# Patient Record
Sex: Female | Born: 1980 | Race: White | Hispanic: No | Marital: Single | Smoking: Current every day smoker
Health system: Southern US, Community
[De-identification: ages and names within clinical notes are randomized; demographics above are authoritative.]

## PROBLEM LIST (undated history)

## (undated) DIAGNOSIS — F410 Panic disorder [episodic paroxysmal anxiety] without agoraphobia: Secondary | ICD-10-CM

## (undated) DIAGNOSIS — J40 Bronchitis, not specified as acute or chronic: Secondary | ICD-10-CM

## (undated) DIAGNOSIS — K219 Gastro-esophageal reflux disease without esophagitis: Secondary | ICD-10-CM

## (undated) DIAGNOSIS — M4710 Other spondylosis with myelopathy, site unspecified: Secondary | ICD-10-CM

## (undated) DIAGNOSIS — N390 Urinary tract infection, site not specified: Secondary | ICD-10-CM

## (undated) DIAGNOSIS — B1920 Unspecified viral hepatitis C without hepatic coma: Secondary | ICD-10-CM

## (undated) DIAGNOSIS — G8929 Other chronic pain: Secondary | ICD-10-CM

## (undated) DIAGNOSIS — F1911 Other psychoactive substance abuse, in remission: Secondary | ICD-10-CM

## (undated) DIAGNOSIS — M545 Low back pain: Secondary | ICD-10-CM

## (undated) DIAGNOSIS — R011 Cardiac murmur, unspecified: Secondary | ICD-10-CM

## (undated) HISTORY — DX: Unspecified viral hepatitis C without hepatic coma: B19.20

## (undated) HISTORY — PX: CARPAL TUNNEL RELEASE: SHX101

---
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
## 2006-01-31 HISTORY — PX: CHOLECYSTECTOMY: SHX55

## 2007-10-02 HISTORY — PX: ANTERIOR CERVICAL DECOMP/DISCECTOMY FUSION: SHX1161

## 2007-10-05 ENCOUNTER — Ambulatory Visit (HOSPITAL_COMMUNITY): Admission: RE | Admit: 2007-10-05 | Discharge: 2007-10-06 | Payer: Self-pay | Admitting: Neurological Surgery

## 2007-11-06 ENCOUNTER — Encounter: Admission: RE | Admit: 2007-11-06 | Discharge: 2007-11-06 | Payer: Self-pay | Admitting: Neurosurgery

## 2007-11-26 ENCOUNTER — Encounter: Admission: RE | Admit: 2007-11-26 | Discharge: 2007-11-26 | Payer: Self-pay | Admitting: Neurological Surgery

## 2008-01-21 ENCOUNTER — Encounter: Admission: RE | Admit: 2008-01-21 | Discharge: 2008-01-21 | Payer: Self-pay | Admitting: Neurological Surgery

## 2008-11-25 ENCOUNTER — Encounter: Admission: RE | Admit: 2008-11-25 | Discharge: 2008-11-25 | Payer: Self-pay | Admitting: Neurological Surgery

## 2008-12-04 ENCOUNTER — Ambulatory Visit (HOSPITAL_COMMUNITY): Admission: RE | Admit: 2008-12-04 | Discharge: 2008-12-04 | Payer: Self-pay | Admitting: Neurological Surgery

## 2009-01-31 HISTORY — PX: TUBAL LIGATION: SHX77

## 2010-05-05 LAB — BASIC METABOLIC PANEL
Chloride: 107 mEq/L (ref 96–112)
Creatinine, Ser: 0.72 mg/dL (ref 0.4–1.2)
Sodium: 140 mEq/L (ref 135–145)

## 2010-05-05 LAB — DIFFERENTIAL
Basophils Absolute: 0 10*3/uL (ref 0.0–0.1)
Basophils Relative: 0 % (ref 0–1)
Eosinophils Absolute: 0 10*3/uL (ref 0.0–0.7)
Eosinophils Relative: 1 % (ref 0–5)
Lymphocytes Relative: 54 % — ABNORMAL HIGH (ref 12–46)
Lymphs Abs: 3.2 10*3/uL (ref 0.7–4.0)
Monocytes Absolute: 0.3 10*3/uL (ref 0.1–1.0)
Neutrophils Relative %: 40 % — ABNORMAL LOW (ref 43–77)

## 2010-05-05 LAB — PROTIME-INR
INR: 1.1 (ref 0.00–1.49)
Prothrombin Time: 14.1 seconds (ref 11.6–15.2)

## 2010-05-05 LAB — CBC
Hemoglobin: 13.2 g/dL (ref 12.0–15.0)
MCHC: 34.8 g/dL (ref 30.0–36.0)
MCV: 91.4 fL (ref 78.0–100.0)
Platelets: 315 10*3/uL (ref 150–400)
RDW: 14.6 % (ref 11.5–15.5)

## 2010-06-15 NOTE — Op Note (Signed)
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   PROCEDURE:  1. Decompressive anterior cervical diskectomy C6-7.  2. Anterior cervical arthrodesis C6-7 utilizing a 6-mm      corticocancellous allograft.  3. Anterior cervical plating C6-7 utilizing a 23-mm Atlantis venture      plate.   SURGEON:  Tia Alert, MD   ASSISTANT:  Kathaleen Maser. Pool, MD   ANESTHESIA:  General endotracheal.   COMPLICATIONS:  None apparent.   INDICATIONS FOR PROCEDURE:  Monica Short is a very pleasant 30 year old  female who is referred with neck and right arm pain.  She had an MRI  which showed breakdown of C6-7 disk with focal kyphosis and focal  cervical disk herniation at C6-7.  I recommended anterior cervical  diskectomy, fusion, and plating at C6-7 in hopes of improving her pain  syndrome.  She understood the risks, benefits, expected outcome and  wished to proceed.   DESCRIPTION OF PROCEDURE:  The patient was taken to the operating room  and after induction of adequate generalized endotracheal anesthesia, she  was placed in supine position on the operating room table.  Her right  anterior cervical region was prepped with DuraPrep and draped in usual  sterile fashion.  3 mL of local anesthesia injected and a transverse  incision was made to the right of midline and carried down to the  platysma, which was elevated, opened, and undermined with Metzenbaum  scissors.  I then dissected in a plane medial to the  sternocleidomastoid  muscle and internal carotid artery, and lateral to the trachea and  esophagus to expose C6-7.  Intraoperative fluoroscopy confirmed my level  and then the longus colli muscles were taken down and the Shadow-Line  retractors were placed under these to expose C6-7.  The annulus was  incised and initial diskectomy was done with pituitary rongeurs and  curved curettes.  I then used high-speed drill to drill the endplates to  prepare for later arthrodesis, drilling in a rectangular fashion.  I  drilled down to the level of the posterior longitudinal ligament,  posterior osteophytes.  The operating microscope was brought to the  field.  The posterior  longitudinal ligament was opened and then removed  in a circumferential fashion, while undercutting the bodies of C6-C7  until the dura was fully relaxed.  It was pushed away firmly when I  first opened the posterior longitudinal ligament, but it fully relaxed  upon decompression and marched along the superior endplate of C7 to  identify the pedicles and marched along the pedicles  to identify the C7  nerve roots bilaterally.  I then marched along the nerve roots to  decompress into the distal foramina.  Once I was done with  decompression, I palpated with a circumferential fashion with a nerve  hook to assure adequate decompression both visually and with tactile  feel.  I then irrigated with saline solution and inspected our  interspace and measured it to be 6 mm.  I used a 6-mm corticocancellous  allograft, tapped this into position at C6-7.  I then used a 23-mm  Atlantis venture plate and placed two 13 mm variable angle screws in the  bodies of C6-C7 and these were locked in plate by the locking mechanism  within the plate.  I then irrigated with saline solution containing  bacitracin, drop of  bleeding points.  Once meticulous hemostasis was  achieved, I closed the platysma with 3-0 Vicryl, subcuticular tissue  with 3-0  Vicryl, and closed the skin with Benzoin and Steri-Strips.  The  drapes were removed.  Sterile dressing was applied.  The patient was  awakened from general anesthesia and transferred to recovery room in  stable condition.  At the end of the procedure, all sponge, needle, and  instrument counts were correct.      Tia Alert, MD  Electronically Signed     DSJ/MEDQ  D:  10/05/2007  T:  10/06/2007  Job:  [id number]

## 2010-11-03 LAB — DIFFERENTIAL
Basophils Relative: 1
Eosinophils Relative: 1
Monocytes Absolute: 0.2
Monocytes Relative: 5

## 2010-11-03 LAB — CBC
MCV: 95.6
WBC: 4.9

## 2010-11-03 LAB — BASIC METABOLIC PANEL
BUN: 6
Chloride: 106
Creatinine, Ser: 0.69
GFR calc non Af Amer: >60
Glucose, Bld: 117 — ABNORMAL HIGH
Sodium: 137

## 2011-03-14 ENCOUNTER — Other Ambulatory Visit: Payer: Self-pay | Admitting: Anesthesiology

## 2011-03-14 DIAGNOSIS — M545 Low back pain: Secondary | ICD-10-CM

## 2011-03-15 ENCOUNTER — Other Ambulatory Visit: Payer: Self-pay

## 2011-03-28 ENCOUNTER — Other Ambulatory Visit: Payer: Self-pay

## 2011-04-06 ENCOUNTER — Other Ambulatory Visit: Payer: Self-pay | Admitting: Neurological Surgery

## 2011-04-06 DIAGNOSIS — M79605 Pain in left leg: Secondary | ICD-10-CM

## 2011-04-06 DIAGNOSIS — M545 Low back pain: Secondary | ICD-10-CM

## 2011-04-07 ENCOUNTER — Other Ambulatory Visit: Payer: Self-pay | Admitting: Neurological Surgery

## 2011-04-07 ENCOUNTER — Ambulatory Visit
Admission: RE | Admit: 2011-04-07 | Discharge: 2011-04-07 | Disposition: A | Discharge status: Home / Self Care | Payer: Medicaid Other | Source: Ambulatory Visit | Attending: Neurological Surgery | Admitting: Neurological Surgery

## 2011-04-07 DIAGNOSIS — M545 Low back pain: Secondary | ICD-10-CM

## 2011-04-07 DIAGNOSIS — M79605 Pain in left leg: Secondary | ICD-10-CM

## 2011-04-07 DIAGNOSIS — M79604 Pain in right leg: Secondary | ICD-10-CM

## 2011-04-07 MED ORDER — KETOROLAC TROMETHAMINE 30 MG/ML IJ SOLN
30.0000 mg | Freq: Once | INTRAMUSCULAR | Status: AC
  Administered 2011-04-07: 30 mg via INTRAVENOUS

## 2011-04-07 MED ORDER — MIDAZOLAM HCL 2 MG/2ML IJ SOLN
1.0000 mg | INTRAMUSCULAR | Status: DC | PRN
  Administered 2011-04-07 (×2): 1 mg via INTRAVENOUS

## 2011-04-07 MED ORDER — IOHEXOL 180 MG/ML  SOLN
3.5000 mL | Freq: Once | INTRAMUSCULAR | Status: AC | PRN
  Administered 2011-04-07: 3.5 mL

## 2011-04-07 MED ORDER — FENTANYL CITRATE 0.05 MG/ML IJ SOLN
25.0000 ug | INTRAMUSCULAR | Status: DC | PRN
  Administered 2011-04-07 (×2): 50 ug via INTRAVENOUS

## 2011-04-07 MED ORDER — ONDANSETRON HCL 4 MG/2ML IJ SOLN
4.0000 mg | Freq: Once | INTRAMUSCULAR | Status: AC
  Administered 2011-04-07: 4 mg via INTRAMUSCULAR

## 2011-04-07 MED ORDER — CEFAZOLIN SODIUM 1-5 GM-% IV SOLN
1.0000 g | Freq: Once | INTRAVENOUS | Status: AC
  Administered 2011-04-07: 1 g via INTRAVENOUS

## 2011-04-07 MED ORDER — SODIUM CHLORIDE 0.9 % IV SOLN
Freq: Once | INTRAVENOUS | Status: AC
  Administered 2011-04-07: 08:00:00 via INTRAVENOUS

## 2011-04-07 MED ORDER — MEPERIDINE HCL 100 MG/ML IJ SOLN
75.0000 mg | Freq: Once | INTRAMUSCULAR | Status: AC
  Administered 2011-04-07: 75 mg via INTRAMUSCULAR

## 2011-04-07 NOTE — Progress Notes (Signed)
Explained discharge instructions to pt, consent signed and IV to be started. Pt has no significant health history.  Lungs clear bilaterally and heart sounds regular.  Right leg pain is worse than left.

## 2011-04-07 NOTE — Discharge Instructions (Signed)
Discogram Post Procedure Discharge Instructions ° °1. May resume a regular diet and any medications that you routinely take (including pain medications). °2. No driving day of procedure. °3. Upon discharge go home and rest for at least 4 hours.  May use an ice pack as needed to injection sites on back.  Ice to back 30 minutes on and 30 minutes off, all day. °4. May remove bandades later, today. °5. It is not unusual to be sore for several days after this procedure. ° ° ° °Please contact our office at [phone] for the following symptoms: ° °· Fever greater than 100 degrees °· Increased swelling, pain, or redness at injection site. ° ° °Thank you for visiting [facility]. ° ° °

## 2011-04-29 ENCOUNTER — Encounter (HOSPITAL_COMMUNITY): Payer: Self-pay | Admitting: Pharmacy Technician

## 2011-05-05 ENCOUNTER — Inpatient Hospital Stay (HOSPITAL_COMMUNITY): Admission: RE | Admit: 2011-05-05 | Discharge: 2011-05-05 | Payer: Medicaid Other | Source: Ambulatory Visit

## 2011-05-10 ENCOUNTER — Encounter (HOSPITAL_COMMUNITY): Payer: Self-pay

## 2011-05-10 ENCOUNTER — Encounter (HOSPITAL_COMMUNITY)
Admission: RE | Admit: 2011-05-10 | Discharge: 2011-05-10 | Disposition: A | Discharge status: Home / Self Care | Payer: Medicaid Other | Source: Ambulatory Visit | Attending: Neurological Surgery | Admitting: Neurological Surgery

## 2011-05-10 HISTORY — DX: Other psychoactive substance abuse, in remission: F19.11

## 2011-05-10 HISTORY — DX: Other spondylosis with myelopathy, site unspecified: M47.10

## 2011-05-10 HISTORY — DX: Panic disorder (episodic paroxysmal anxiety): F41.0

## 2011-05-10 LAB — BASIC METABOLIC PANEL
BUN: 10 mg/dL (ref 6–23)
CO2: 31 mEq/L (ref 19–32)
Calcium: 9.4 mg/dL (ref 8.4–10.5)
GFR calc non Af Amer: >90 mL/min (ref >90)
Glucose, Bld: 83 mg/dL (ref 70–99)

## 2011-05-10 LAB — DIFFERENTIAL
Basophils Relative: 0 % (ref 0–1)
Eosinophils Absolute: 0.1 10*3/uL (ref 0.0–0.7)
Eosinophils Relative: 2 % (ref 0–5)
Lymphs Abs: 3.8 10*3/uL (ref 0.7–4.0)
Monocytes Relative: 7 % (ref 3–12)
Neutrophils Relative %: 35 % — ABNORMAL LOW (ref 43–77)

## 2011-05-10 LAB — TYPE AND SCREEN
ABO/RH(D): A POS
Antibody Screen: NEGATIVE

## 2011-05-10 LAB — CBC
Hemoglobin: 13.4 g/dL (ref 12.0–15.0)
MCH: 32.4 pg (ref 26.0–34.0)
MCHC: 35.3 g/dL (ref 30.0–36.0)
MCV: 92 fL (ref 78.0–100.0)
Platelets: 307 10*3/uL (ref 150–400)
RBC: 4.13 MIL/uL (ref 3.87–5.11)

## 2011-05-10 LAB — PROTIME-INR: INR: 1.02 (ref 0.00–1.49)

## 2011-05-10 LAB — ABO/RH: ABO/RH(D): A POS

## 2011-05-10 NOTE — Pre-Procedure Instructions (Signed)
20 MINSA W[address]DINGTON  05/10/2011   Your procedure is scheduled on:  *Friday, April 12**  Report to [facility] at 0530 AM.  Call this number if you have problems the morning of surgery: [phone]   Remember:   Do not eat food:After Midnight.  May have clear liquids: up to 4 Hours before arrival. (1:30)  Clear liquids include soda, tea, black coffee, apple or grape juice, broth.  Take these medicines the morning of surgery with A SIP OF WATER: *none**   Do not wear jewelry, make-up or nail polish.  Do not wear lotions, powders, or perfumes. You may wear deodorant.  Do not shave 48 hours prior to surgery.  Do not bring valuables to the hospital.  Contacts, dentures or bridgework may not be worn into surgery.  Leave suitcase in the car. After surgery it may be brought to your room.  For patients admitted to the hospital, checkout time is 11:00 AM the day of discharge.   Patients discharged the day of surgery will not be allowed to drive home.  Name and phone number of your driver: *n/a**  Special Instructions: CHG Shower Use Special Wash: 1/2 bottle night before surgery and 1/2 bottle morning of surgery.   Please read over the following fact sheets that you were given: Pain Booklet, Coughing and Deep Breathing, Blood Transfusion Information, MRSA Information and Surgical Site Infection Prevention

## 2011-05-12 MED ORDER — CEFAZOLIN SODIUM 1-5 GM-% IV SOLN: 1.0000 g | INTRAVENOUS | Status: DC

## 2011-05-12 MED ORDER — DEXAMETHASONE SODIUM PHOSPHATE 10 MG/ML IJ SOLN
10.0000 mg | Freq: Once | INTRAMUSCULAR | Status: AC
  Administered 2011-05-13: 10 mg via INTRAVENOUS
  Filled 2011-05-12 (×2): qty 1

## 2011-05-13 ENCOUNTER — Encounter (HOSPITAL_COMMUNITY)
Admission: RE | Disposition: A | Discharge status: Home / Self Care | Payer: Self-pay | Source: Ambulatory Visit | Attending: Neurological Surgery

## 2011-05-13 ENCOUNTER — Encounter (HOSPITAL_COMMUNITY): Payer: Self-pay | Admitting: *Deleted

## 2011-05-13 ENCOUNTER — Ambulatory Visit (HOSPITAL_COMMUNITY): Payer: Medicaid Other | Admitting: Anesthesiology

## 2011-05-13 ENCOUNTER — Encounter (HOSPITAL_COMMUNITY): Payer: Self-pay | Admitting: Anesthesiology

## 2011-05-13 ENCOUNTER — Ambulatory Visit (HOSPITAL_COMMUNITY): Payer: Medicaid Other

## 2011-05-13 ENCOUNTER — Inpatient Hospital Stay (HOSPITAL_COMMUNITY)
Admission: RE | Admit: 2011-05-13 | Discharge: 2011-05-17 | DRG: 460 | Disposition: A | Discharge status: Home / Self Care | Payer: Medicaid Other | Source: Ambulatory Visit | Attending: Neurological Surgery | Admitting: Neurological Surgery

## 2011-05-13 ENCOUNTER — Encounter (HOSPITAL_COMMUNITY): Payer: Self-pay | Admitting: General Practice

## 2011-05-13 DIAGNOSIS — F410 Panic disorder [episodic paroxysmal anxiety] without agoraphobia: Secondary | ICD-10-CM

## 2011-05-13 DIAGNOSIS — Z23 Encounter for immunization: Secondary | ICD-10-CM

## 2011-05-13 DIAGNOSIS — Z01812 Encounter for preprocedural laboratory examination: Secondary | ICD-10-CM

## 2011-05-13 DIAGNOSIS — Z6831 Body mass index (BMI) 31.0-31.9, adult: Secondary | ICD-10-CM

## 2011-05-13 DIAGNOSIS — Z79899 Other long term (current) drug therapy: Secondary | ICD-10-CM

## 2011-05-13 DIAGNOSIS — M5126 Other intervertebral disc displacement, lumbar region: Principal | ICD-10-CM | POA: Diagnosis present

## 2011-05-13 DIAGNOSIS — F17200 Nicotine dependence, unspecified, uncomplicated: Secondary | ICD-10-CM | POA: Diagnosis present

## 2011-05-13 DIAGNOSIS — Z981 Arthrodesis status: Secondary | ICD-10-CM

## 2011-05-13 DIAGNOSIS — F1911 Other psychoactive substance abuse, in remission: Secondary | ICD-10-CM | POA: Diagnosis present

## 2011-05-13 DIAGNOSIS — Z01818 Encounter for other preprocedural examination: Secondary | ICD-10-CM

## 2011-05-13 HISTORY — DX: Panic disorder (episodic paroxysmal anxiety): F41.0

## 2011-05-13 HISTORY — DX: Other chronic pain: G89.29

## 2011-05-13 HISTORY — DX: Low back pain: M54.5

## 2011-05-13 HISTORY — DX: Gastro-esophageal reflux disease without esophagitis: K21.9

## 2011-05-13 HISTORY — DX: Bronchitis, not specified as acute or chronic: J40

## 2011-05-13 HISTORY — PX: POSTERIOR FUSION LUMBAR SPINE: SUR632

## 2011-05-13 HISTORY — DX: Urinary tract infection, site not specified: N39.0

## 2011-05-13 HISTORY — DX: Cardiac murmur, unspecified: R01.1

## 2011-05-13 LAB — HCG, SERUM, QUALITATIVE: Preg, Serum: NEGATIVE

## 2011-05-13 SURGERY — POSTERIOR LUMBAR FUSION 2 LEVEL
Anesthesia: General | Site: Back | Wound class: Clean

## 2011-05-13 MED ORDER — GLYCOPYRROLATE 0.2 MG/ML IJ SOLN
INTRAMUSCULAR | Status: DC | PRN
  Administered 2011-05-13: .6 mg via INTRAVENOUS

## 2011-05-13 MED ORDER — CELECOXIB 200 MG PO CAPS
200.0000 mg | ORAL_CAPSULE | Freq: Two times a day (BID) | ORAL | Status: DC
  Administered 2011-05-13 – 2011-05-17 (×8): 200 mg via ORAL
  Filled 2011-05-13 (×9): qty 1

## 2011-05-13 MED ORDER — MENTHOL 3 MG MT LOZG: 1.0000 | LOZENGE | OROMUCOSAL | Status: DC | PRN

## 2011-05-13 MED ORDER — MORPHINE SULFATE 4 MG/ML IJ SOLN
1.0000 mg | INTRAMUSCULAR | Status: DC | PRN
  Administered 2011-05-13 – 2011-05-16 (×2): 4 mg via INTRAVENOUS
  Filled 2011-05-13 (×3): qty 1

## 2011-05-13 MED ORDER — SODIUM CHLORIDE 0.9 % IR SOLN
Status: DC | PRN
  Administered 2011-05-13: 07:00:00

## 2011-05-13 MED ORDER — KETOROLAC TROMETHAMINE 30 MG/ML IJ SOLN
INTRAMUSCULAR | Status: AC
  Administered 2011-05-13: 30 mg via INTRAVENOUS
  Filled 2011-05-13: qty 1

## 2011-05-13 MED ORDER — BUPIVACAINE HCL (PF) 0.25 % IJ SOLN
INTRAMUSCULAR | Status: DC | PRN
  Administered 2011-05-13: 30 mL

## 2011-05-13 MED ORDER — METHADONE HCL 10 MG/ML PO CONC: 75.0000 mg | Freq: Every day | ORAL | Status: DC

## 2011-05-13 MED ORDER — LACTATED RINGERS IV SOLN
INTRAVENOUS | Status: DC | PRN
  Administered 2011-05-13 (×2): via INTRAVENOUS

## 2011-05-13 MED ORDER — BACITRACIN 50000 UNITS IM SOLR
INTRAMUSCULAR | Status: AC
  Filled 2011-05-13: qty 1

## 2011-05-13 MED ORDER — NEOSTIGMINE METHYLSULFATE 1 MG/ML IJ SOLN
INTRAMUSCULAR | Status: DC | PRN
  Administered 2011-05-13: 4 mg via INTRAVENOUS

## 2011-05-13 MED ORDER — MORPHINE SULFATE 2 MG/ML IJ SOLN: 0.0500 mg/kg | INTRAMUSCULAR | Status: DC | PRN

## 2011-05-13 MED ORDER — SUFENTANIL CITRATE 50 MCG/ML IV SOLN
INTRAVENOUS | Status: DC | PRN
  Administered 2011-05-13 (×2): 10 ug via INTRAVENOUS
  Administered 2011-05-13 (×2): 5 ug via INTRAVENOUS
  Administered 2011-05-13: 20 ug via INTRAVENOUS
  Administered 2011-05-13: 10 ug via INTRAVENOUS

## 2011-05-13 MED ORDER — VECURONIUM BROMIDE 10 MG IV SOLR
INTRAVENOUS | Status: DC | PRN
  Administered 2011-05-13 (×2): 1 mg via INTRAVENOUS
  Administered 2011-05-13: 2 mg via INTRAVENOUS

## 2011-05-13 MED ORDER — ACETAMINOPHEN 650 MG RE SUPP: 650.0000 mg | RECTAL | Status: DC | PRN

## 2011-05-13 MED ORDER — PNEUMOCOCCAL VAC POLYVALENT 25 MCG/0.5ML IJ INJ
0.5000 mL | INJECTION | Freq: Once | INTRAMUSCULAR | Status: AC
  Administered 2011-05-13: 0.5 mL via INTRAMUSCULAR
  Filled 2011-05-13: qty 0.5

## 2011-05-13 MED ORDER — ACETAMINOPHEN 325 MG PO TABS: 650.0000 mg | ORAL_TABLET | ORAL | Status: DC | PRN

## 2011-05-13 MED ORDER — PHENOL 1.4 % MT LIQD: 1.0000 | OROMUCOSAL | Status: DC | PRN

## 2011-05-13 MED ORDER — PROPOFOL 10 MG/ML IV EMUL
INTRAVENOUS | Status: DC | PRN
  Administered 2011-05-13: 160 mg via INTRAVENOUS

## 2011-05-13 MED ORDER — LIDOCAINE HCL (CARDIAC) 20 MG/ML IV SOLN
INTRAVENOUS | Status: DC | PRN
  Administered 2011-05-13: 30 mg via INTRAVENOUS

## 2011-05-13 MED ORDER — THROMBIN 20000 UNITS EX KIT
PACK | CUTANEOUS | Status: DC | PRN
  Administered 2011-05-13: 07:00:00 via TOPICAL

## 2011-05-13 MED ORDER — POTASSIUM CHLORIDE IN NACL 20-0.9 MEQ/L-% IV SOLN
INTRAVENOUS | Status: DC
  Filled 2011-05-13 (×9): qty 1000

## 2011-05-13 MED ORDER — ARTIFICIAL TEARS OP OINT
TOPICAL_OINTMENT | OPHTHALMIC | Status: DC | PRN
  Administered 2011-05-13: 1 via OPHTHALMIC

## 2011-05-13 MED ORDER — SODIUM CHLORIDE 0.9 % IV SOLN
INTRAVENOUS | Status: AC
  Filled 2011-05-13: qty 500

## 2011-05-13 MED ORDER — KETOROLAC TROMETHAMINE 30 MG/ML IJ SOLN
30.0000 mg | Freq: Once | INTRAMUSCULAR | Status: AC
  Administered 2011-05-13: 30 mg via INTRAVENOUS
  Filled 2011-05-13: qty 1

## 2011-05-13 MED ORDER — ONDANSETRON HCL 4 MG/2ML IJ SOLN
INTRAMUSCULAR | Status: DC | PRN
  Administered 2011-05-13: 4 mg via INTRAVENOUS

## 2011-05-13 MED ORDER — DIAZEPAM 5 MG/ML IJ SOLN
INTRAMUSCULAR | Status: AC
  Administered 2011-05-13: 2.5 mg via INTRAVENOUS
  Filled 2011-05-13: qty 2

## 2011-05-13 MED ORDER — DEXAMETHASONE SODIUM PHOSPHATE 4 MG/ML IJ SOLN
4.0000 mg | Freq: Four times a day (QID) | INTRAMUSCULAR | Status: DC
  Filled 2011-05-13 (×16): qty 1

## 2011-05-13 MED ORDER — SODIUM CHLORIDE 0.9 % IV SOLN: 250.0000 mL | INTRAVENOUS | Status: DC

## 2011-05-13 MED ORDER — KETOROLAC TROMETHAMINE 30 MG/ML IJ SOLN
30.0000 mg | Freq: Once | INTRAMUSCULAR | Status: AC
  Administered 2011-05-13: 30 mg via INTRAVENOUS

## 2011-05-13 MED ORDER — SENNA 8.6 MG PO TABS
1.0000 | ORAL_TABLET | Freq: Two times a day (BID) | ORAL | Status: DC
  Administered 2011-05-13 – 2011-05-17 (×8): 8.6 mg via ORAL
  Filled 2011-05-13 (×9): qty 1

## 2011-05-13 MED ORDER — ONDANSETRON HCL 4 MG/2ML IJ SOLN: 4.0000 mg | INTRAMUSCULAR | Status: DC | PRN

## 2011-05-13 MED ORDER — DEXAMETHASONE 4 MG PO TABS
4.0000 mg | ORAL_TABLET | Freq: Four times a day (QID) | ORAL | Status: DC
  Administered 2011-05-13 – 2011-05-17 (×15): 4 mg via ORAL
  Filled 2011-05-13 (×19): qty 1

## 2011-05-13 MED ORDER — 0.9 % SODIUM CHLORIDE (POUR BTL) OPTIME
TOPICAL | Status: DC | PRN
  Administered 2011-05-13: 1000 mL

## 2011-05-13 MED ORDER — MIDAZOLAM HCL 5 MG/5ML IJ SOLN
INTRAMUSCULAR | Status: DC | PRN
  Administered 2011-05-13: 2 mg via INTRAVENOUS

## 2011-05-13 MED ORDER — OXYCODONE-ACETAMINOPHEN 5-325 MG PO TABS
1.0000 | ORAL_TABLET | ORAL | Status: DC | PRN
  Administered 2011-05-13 – 2011-05-15 (×6): 2 via ORAL
  Administered 2011-05-15: 1 via ORAL
  Administered 2011-05-15 – 2011-05-17 (×8): 2 via ORAL
  Filled 2011-05-13 (×15): qty 2

## 2011-05-13 MED ORDER — HETASTARCH-ELECTROLYTES 6 % IV SOLN
INTRAVENOUS | Status: DC | PRN
  Administered 2011-05-13: 11:00:00 via INTRAVENOUS

## 2011-05-13 MED ORDER — METHADONE HCL 10 MG PO TABS
75.0000 mg | ORAL_TABLET | Freq: Every day | ORAL | Status: DC
  Administered 2011-05-13 – 2011-05-17 (×5): 75 mg via ORAL
  Filled 2011-05-13 (×3): qty 7
  Filled 2011-05-13: qty 8
  Filled 2011-05-13: qty 7

## 2011-05-13 MED ORDER — SODIUM CHLORIDE 0.9 % IJ SOLN
3.0000 mL | Freq: Two times a day (BID) | INTRAMUSCULAR | Status: DC
  Administered 2011-05-13 – 2011-05-15 (×3): 3 mL via INTRAVENOUS

## 2011-05-13 MED ORDER — ROCURONIUM BROMIDE 100 MG/10ML IV SOLN
INTRAVENOUS | Status: DC | PRN
  Administered 2011-05-13: 50 mg via INTRAVENOUS

## 2011-05-13 MED ORDER — LACTATED RINGERS IV SOLN: INTRAVENOUS | Status: DC

## 2011-05-13 MED ORDER — HYDROMORPHONE HCL PF 1 MG/ML IJ SOLN
0.2500 mg | INTRAMUSCULAR | Status: DC | PRN
  Administered 2011-05-13 (×4): 0.5 mg via INTRAVENOUS

## 2011-05-13 MED ORDER — SODIUM CHLORIDE 0.9 % IJ SOLN: 3.0000 mL | INTRAMUSCULAR | Status: DC | PRN

## 2011-05-13 MED ORDER — CEFAZOLIN SODIUM 1-5 GM-% IV SOLN
1.0000 g | Freq: Three times a day (TID) | INTRAVENOUS | Status: AC
  Administered 2011-05-13 – 2011-05-14 (×2): 1 g via INTRAVENOUS
  Filled 2011-05-13 (×2): qty 50

## 2011-05-13 MED ORDER — HYDROMORPHONE HCL PF 1 MG/ML IJ SOLN
INTRAMUSCULAR | Status: AC
  Administered 2011-05-13: 0.5 mg via INTRAVENOUS
  Filled 2011-05-13: qty 1

## 2011-05-13 MED ORDER — CEFAZOLIN SODIUM-DEXTROSE 2-3 GM-% IV SOLR
INTRAVENOUS | Status: AC
  Filled 2011-05-13: qty 50

## 2011-05-13 MED ORDER — METHOCARBAMOL 500 MG PO TABS
500.0000 mg | ORAL_TABLET | Freq: Four times a day (QID) | ORAL | Status: DC | PRN
  Administered 2011-05-13 – 2011-05-17 (×13): 500 mg via ORAL
  Filled 2011-05-13 (×14): qty 1

## 2011-05-13 MED ORDER — CEFAZOLIN SODIUM-DEXTROSE 2-3 GM-% IV SOLR
2.0000 g | INTRAVENOUS | Status: AC
  Administered 2011-05-13: 2 g via INTRAVENOUS

## 2011-05-13 MED ORDER — DIAZEPAM 5 MG/ML IJ SOLN
2.5000 mg | Freq: Once | INTRAMUSCULAR | Status: AC
  Administered 2011-05-13: 2.5 mg via INTRAVENOUS

## 2011-05-13 MED ORDER — METHOCARBAMOL 100 MG/ML IJ SOLN
500.0000 mg | Freq: Four times a day (QID) | INTRAVENOUS | Status: DC | PRN
  Administered 2011-05-13: 500 mg via INTRAVENOUS
  Filled 2011-05-13: qty 5

## 2011-05-13 SURGICAL SUPPLY — 64 items
BAG DECANTER FOR FLEXI CONT (MISCELLANEOUS) ×2 IMPLANT
BENZOIN TINCTURE PRP APPL 2/3 (GAUZE/BANDAGES/DRESSINGS) ×2 IMPLANT
BLADE SURG ROTATE 9660 (MISCELLANEOUS) IMPLANT
BONE EQUIVA 10CC (Bone Implant) ×2 IMPLANT
BUR MATCHSTICK NEURO 3.0 LAGG (BURR) ×2 IMPLANT
CANISTER SUCTION 2500CC (MISCELLANEOUS) ×2 IMPLANT
CLOTH BEACON ORANGE TIMEOUT ST (SAFETY) ×2 IMPLANT
CONT SPEC 4OZ CLIKSEAL STRL BL (MISCELLANEOUS) ×4 IMPLANT
COVER BACK TABLE 24X17X13 BIG (DRAPES) IMPLANT
COVER TABLE BACK 60X90 (DRAPES) ×2 IMPLANT
DRAPE C-ARM 42X72 X-RAY (DRAPES) ×4 IMPLANT
DRAPE LAPAROTOMY 100X72X124 (DRAPES) ×2 IMPLANT
DRAPE POUCH INSTRU U-SHP 10X18 (DRAPES) ×2 IMPLANT
DRAPE SURG 17X23 STRL (DRAPES) ×2 IMPLANT
DRESSING TELFA 8X3 (GAUZE/BANDAGES/DRESSINGS) ×2 IMPLANT
DRSG OPSITE 4X5.5 SM (GAUZE/BANDAGES/DRESSINGS) ×2 IMPLANT
DURAPREP 26ML APPLICATOR (WOUND CARE) ×2 IMPLANT
ELECT REM PT RETURN 9FT ADLT (ELECTROSURGICAL) ×2
ELECTRODE REM PT RTRN 9FT ADLT (ELECTROSURGICAL) ×1 IMPLANT
EVACUATOR 1/8 PVC DRAIN (DRAIN) ×2 IMPLANT
GAUZE SPONGE 4X4 16PLY XRAY LF (GAUZE/BANDAGES/DRESSINGS) ×2 IMPLANT
GLOVE BIO SURGEON STRL SZ8 (GLOVE) ×4 IMPLANT
GLOVE BIOGEL PI IND STRL 7.0 (GLOVE) ×1 IMPLANT
GLOVE BIOGEL PI IND STRL 8 (GLOVE) ×1 IMPLANT
GLOVE BIOGEL PI INDICATOR 7.0 (GLOVE) ×1
GLOVE BIOGEL PI INDICATOR 8 (GLOVE) ×1
GLOVE ECLIPSE 7.5 STRL STRAW (GLOVE) ×10 IMPLANT
GLOVE SURG SS PI 7.0 STRL IVOR (GLOVE) ×2 IMPLANT
GOWN BRE IMP SLV AUR LG STRL (GOWN DISPOSABLE) IMPLANT
GOWN BRE IMP SLV AUR XL STRL (GOWN DISPOSABLE) ×6 IMPLANT
GOWN STRL REIN 2XL LVL4 (GOWN DISPOSABLE) IMPLANT
HEMOSTAT POWDER KIT SURGIFOAM (HEMOSTASIS) IMPLANT
KIT BASIN OR (CUSTOM PROCEDURE TRAY) ×2 IMPLANT
KIT ROOM TURNOVER OR (KITS) ×2 IMPLANT
NEEDLE HYPO 25X1 1.5 SAFETY (NEEDLE) ×2 IMPLANT
NS IRRIG 1000ML POUR BTL (IV SOLUTION) ×2 IMPLANT
PACK LAMINECTOMY NEURO (CUSTOM PROCEDURE TRAY) ×2 IMPLANT
PAD ARMBOARD 7.5X6 YLW CONV (MISCELLANEOUS) ×6 IMPLANT
PEDIGUARD CURV (INSTRUMENTS) ×2 IMPLANT
ROD SEQUOIA BENT 5.5X60MM (Rod) ×4 IMPLANT
SCREW POLY 6.5X40MM (Screw) ×4 IMPLANT
SCREW POLY 6.5X45 (Screw) ×2 IMPLANT
SCREW POLY 6.5X45MM (Screw) ×2 IMPLANT
SCREW SEQUOIA 6.5X30MM (Screw) ×2 IMPLANT
SCREW SEQUOIA 6.5X35MM (Screw) ×4 IMPLANT
SPEEDLINK 11 MEDIUM (Rod) ×2 IMPLANT
SPONGE LAP 4X18 X RAY DECT (DISPOSABLE) IMPLANT
SPONGE SURGIFOAM ABS GEL 100 (HEMOSTASIS) ×2 IMPLANT
STRIP CLOSURE SKIN 1/2X4 (GAUZE/BANDAGES/DRESSINGS) ×2 IMPLANT
SUT VIC AB 0 CT1 18XCR BRD8 (SUTURE) ×2 IMPLANT
SUT VIC AB 0 CT1 8-18 (SUTURE) ×2
SUT VIC AB 2-0 CP2 18 (SUTURE) ×4 IMPLANT
SUT VIC AB 3-0 SH 8-18 (SUTURE) ×4 IMPLANT
SYR 20ML ECCENTRIC (SYRINGE) ×2 IMPLANT
TELAMON 12X22 (Cage) ×2 IMPLANT
TELAMON 22X10 (Cage) ×2 IMPLANT
TOP CLSR SEQUOIA (Orthopedic Implant) ×12 IMPLANT
TOWEL OR 17X24 6PK STRL BLUE (TOWEL DISPOSABLE) ×2 IMPLANT
TOWEL OR 17X26 10 PK STRL BLUE (TOWEL DISPOSABLE) ×2 IMPLANT
TRAP SPECIMEN MUCOUS 40CC (MISCELLANEOUS) ×2 IMPLANT
TRAY FOLEY CATH 14FRSI W/METER (CATHETERS) ×2 IMPLANT
WATER STERILE IRR 1000ML POUR (IV SOLUTION) ×2 IMPLANT
WEDGE TANGENT 10X26MM ×2 IMPLANT
WEDGE TANGENT 12X26MM ×2 IMPLANT

## 2011-05-13 NOTE — Progress Notes (Signed)
Patient ID: Monica Short, female   DOB: [birth year], 31 y.o.   MRN: [id number] Pt complaining of back pain radiating to lateral hips, and some into the upper anterior thigh in recovery room. Denies numbness and tingling. Has good strength in the hip flexors, knee extensors, dorsiflexors and plantar flexors to in-bed exam. Hemovac working correctly. Suspect this is post-op radiculitis from nerve retraction and distraction of disc space, especially given lack of N/T/W. However, this could represent post-op hematoma formation, and will monitor closely in recovery for now. Discussed with 2 other neurosurgeons and we all agree this not warrant early re-exploration or imaging at this point. We'll give a small dose of Valium for muscle relaxation and a dose of Toradol for pain, and monitor. She was on a high dose of methadone preoperatively along with Percocet,  So pain control may be an issue.

## 2011-05-13 NOTE — Anesthesia Postprocedure Evaluation (Signed)
  Anesthesia Post-op Note  Patient: Monica Short  Procedure(s) Performed: Procedure(s) (LRB): POSTERIOR LUMBAR FUSION 2 LEVEL (N/A)  Patient Location: PACU  Anesthesia Type: General  Level of Consciousness: awake  Airway and Oxygen Therapy: Patient Spontanous Breathing  Post-op Pain: mild  Post-op Assessment: Post-op Vital signs reviewed  Post-op Vital Signs: Reviewed  Complications: No apparent anesthesia complications

## 2011-05-13 NOTE — Transfer of Care (Signed)
Immediate Anesthesia Transfer of Care Note  Patient: Monica Short  Procedure(s) Performed: Procedure(s) (LRB): POSTERIOR LUMBAR FUSION 2 LEVEL (N/A)  Patient Location: PACU  Anesthesia Type: General  Level of Consciousness: awake, alert , oriented and patient cooperative  Airway & Oxygen Therapy: Patient Spontanous Breathing and Patient connected to face mask oxygen  Post-op Assessment: Report given to PACU RN, Post -op Vital signs reviewed and stable and Patient moving all extremities X 4  Post vital signs: Reviewed and stable  Complications: No apparent anesthesia complications

## 2011-05-13 NOTE — Progress Notes (Signed)
Dr Yetta Barre requests pt to stay in PACU until he is able to re-evaluate her (after he finishes in OR).

## 2011-05-13 NOTE — Op Note (Signed)
05/13/2011  11:53 AM  PATIENT:  Monica Short  31 y.o. female  PRE-OPERATIVE DIAGNOSIS:  Lumbar degenerative disc disease and spondylosis with lumbar disc herniation an annular tear L4-5 L5-S1, back and right leg pain  POST-OPERATIVE DIAGNOSIS:  Same  PROCEDURE:   1. Decompressive lumbar laminectomy L4-5 L5-S1 requiring more work than would be required of the typical PLIF procedure in order to adequately decompress the neural elements.  2. Posterior lumbar interbody fusion L4-5 L5-S1 using a PEEK interbody cage packed with morcellized allograft and autograft and an allograft wedge.  3. Posterior fixation L4-S1 using Zimmer Sequoia pedicle screws.  4. Intertransverse arthrodesis L4-S1 using morcellized autograft and allograft.  SURGEON:  Marikay Alar, MD  ASSISTANTS: Dr. Gerlene Fee  ANESTHESIA:  General  EBL: 100 ml  Total I/O In: 3800 [I.V.:3300; IV Piggyback:500] Out: 220 [Urine:120; Blood:100]  BLOOD ADMINISTERED:none  DRAINS: Hemovac   INDICATION FOR PROCEDURE: This patient presented with severe back pain and right leg pain. MRI showed a small herniated disc to the right with an annular tear L5-S1 with disc desiccation. Discogram had severe concordant pain at L4-5 L5-S1 with a normal control at L3-4. She tried medical management without relief. Recommended a 2 level lumbar fusion. Patient understood the risks, benefits, and alternatives and potential outcomes and wished to proceed.  PROCEDURE DETAILS:  The patient was brought to the operating room. After induction of generalized endotracheal anesthesia the patient was rolled into the prone position on chest rolls and all pressure points were padded. The patient's lumbar region was cleaned and then prepped with DuraPrep and draped in the usual sterile fashion. Anesthesia was injected and then a dorsal midline incision was made and carried down to the lumbosacral fascia. The fascia was opened and the paraspinous musculature was taken  down in a subperiosteal fashion to expose L4-5 and L5-S1. Intraoperative fluoroscopy confirmed my level, and the spinous process was removed and complete lumbar laminectomies, hemi- facetectomies, and foraminotomies were performed at 45 and L5-S1. The yellow ligament was removed to expose the underlying dura and nerve roots, and generous foraminotomies were performed to adequately decompress the neural elements. Wide foraminotomies were performed at L4-L5 and S1 nerve roots were decompressed. Once the decompression was complete, I turned my attention to the posterior lower lumbar interbody fusion. The epidural venous vasculature was coagulated and cut sharply. Disc space was incised and the initial discectomy was performed with pituitary rongeurs at both levels. The disc space was distracted with sequential distractors to a height of 12 mm at L4-5 and 10 mm at L5-S1. We then used a series of scrapers and shavers to prepare the endplates for fusion at both levels. The midline was prepared with Epstein curettes. Once the complete discectomy was finished, we packed an appropriate sized peek interbody cage with local autograft and morcellized allograft, gently retracted the nerve root, and tapped the cage into position at L4-5 and L5-S1. We also tapped a tangent interbody bone wedge into the opposite side. The midline was packed with morselized autograft and allograft. We then turned our attention to the posterior fixation. The pedicle screw entry zones were identified utilizing surface landmarks and fluoroscopy. We probed each pedicle with the pedicle probe and tapped each pedicle with the appropriate tap. We palpated with a ball probe to assure no break in the cortex. We then placed 6 5 x 45 mm pedicle screws into the pedicles bilaterally at L4, 6 5 x 40 mm pedicle screws and L5, and 6 5 x  35 mm pedicle screws into the sacrum. We then decorticated the transverse processes and laid a mixture of morcellized autograft  and allograft out over these to perform intertransverse arthrodesis at L4-S1. We then placed lordotic rods into the multiaxial screw heads of the pedicle screws and locked these in position with the locking caps and anti-torque device. We achieved compression of our grafts. We then checked our construct with AP and lateral fluoroscopy. Irrigated with copious amounts of bacitracin-containing saline solution. Placed a medium Hemovac drain through separate stab incision. Inspected the nerve roots once again to assure adequate decompression, lined to the dura with Gelfoam, dried up the surgical bed and closed the muscle and the fascia with 0 Vicryl. Closed the subcutaneous tissues with 2-0 Vicryl and subcuticular tissues with 3-0 Vicryl. The skin was closed with benzoin and Steri-Strips. Dressing was then applied, the patient was awakened from general anesthesia and transported to the recovery room in stable condition. At the end of the procedure all sponge, needle and instrument counts were correct.   PLAN OF CARE: Admit to inpatient   PATIENT DISPOSITION:  PACU - hemodynamically stable.   Delay start of Pharmacological VTE agent (>24hrs) due to surgical blood loss or risk of bleeding:  yes

## 2011-05-13 NOTE — Anesthesia Preprocedure Evaluation (Addendum)
Anesthesia Evaluation  Patient identified by MRN, date of birth, ID band Patient awake    Reviewed: Allergy & Precautions, H&P , NPO status , Patient's Chart, lab work & pertinent test results  Airway Mallampati: II TM Distance: >3 FB   Mouth opening: Limited Mouth Opening  Dental  (+) Upper Dentures and Lower Dentures   Pulmonary neg pulmonary ROS, Current Smoker,  breath sounds clear to auscultation        Cardiovascular Rhythm:Regular Rate:Normal     Neuro/Psych negative neurological ROS     GI/Hepatic negative GI ROS, Neg liver ROS,   Endo/Other  negative endocrine ROS  Renal/GU negative Renal ROS     Musculoskeletal negative musculoskeletal ROS (+)   Abdominal   Peds  Hematology negative hematology ROS (+)   Anesthesia Other Findings   Reproductive/Obstetrics                          Anesthesia Physical Anesthesia Plan  ASA: II  Anesthesia Plan: General   Post-op Pain Management:    Induction: Intravenous  Airway Management Planned: Oral ETT  Additional Equipment:   Intra-op Plan:   Post-operative Plan: Extubation in OR  Informed Consent:   Plan Discussed with: CRNA  Anesthesia Plan Comments:         Anesthesia Quick Evaluation

## 2011-05-13 NOTE — H&P (Signed)
Subjective: Patient is a 31 y.o. Short admitted for PLIF L4-5 L5-S1 for DDD/ back pain. Onset of symptoms was months ago, progressively worse since that time.  The pain is rated severe, and is located at the low back and radiates to leg. The pain is described as aching/ throbbing and occurs constantly. The symptoms have been progressive. Symptoms are exacerbated by exercise/ standing. MRI or CT showed DDD, diskogram + at L4-5, L5-S1.   Past Medical History  Diagnosis Date  . 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 Anxiety attack   . History of substance abuse     clean for 8 years    Past Surgical History  Procedure Date  . Cholecystectomy 2008  . Cervical spine surgery 2009  . Tonsillectomy 1992  . Carpal tunnel release 2010/2013    bilaterlly  . Tubal ligation 2011    Prior to Admission medications   Medication Sig Start Date End Date Taking? Authorizing Provider  methadone (DOLOPHINE) 10 MG/ML solution Take 75 mg by mouth daily.   Yes Historical Provider, MD  oxyCODONE-acetaminophen (PERCOCET) 5-325 MG per tablet Take 1 tablet by mouth every 4 (four) hours as needed.    Historical Provider, MD   Allergies  Allergen Reactions  . Chlorhexidine Rash    Pt states she can use the soap as long as it is removed and not left on the skin for a long  time    History  Substance Use Topics  . Smoking status: Current Everyday Smoker -- 0.5 packs/day for 18 years    Types: Cigarettes  . Smokeless tobacco: Never Used  . Alcohol Use: 1.8 oz/week    3 Cans of beer per week    Family History  Problem Relation Age of Onset  . Anesthesia problems Neg Hx      Review of Systems  Positive ROS: neg  All other systems have been reviewed and were otherwise negative with the exception of those mentioned in the HPI and as above.  Objective: Vital signs in last 24 hours: Temp:  [98 F (36.7 C)] 98 F (36.7 C) (04/12 0615) Pulse Rate:  [70] 70  (04/12 0615) Resp:   [18] 18  (04/12 0615) BP: (132)/(81) 132/81 mmHg (04/12 0615) SpO2:  [99 %] 99 % (04/12 0615)  General Appearance: Alert, cooperative, no distress, appears stated age Head: Normocephalic, without obvious abnormality, atraumatic Eyes: PERRL, conjunctiva/corneas clear, EOM's intact, fundi benign, both eyes      Ears: Normal TM's and external ear canals, both ears Throat: Lips, mucosa, and tongue normal; teeth and gums normal Neck: Supple, symmetrical, trachea midline, no adenopathy; thyroid: No enlargement/tenderness/nodules; no carotid bruit or JVD Back: Symmetric, no curvature, ROM normal, no CVA tenderness Lungs: Clear to auscultation bilaterally, respirations unlabored Heart: Regular rate and rhythm, S1 and S2 normal, no murmur, rub or gallop Abdomen: Soft, non-tender, bowel sounds active all four quadrants, no masses, no organomegaly Extremities: Extremities normal, atraumatic, no cyanosis or edema Pulses: 2+ and symmetric all extremities Skin: Skin color, texture, turgor normal, no rashes or lesions  NEUROLOGIC:   Mental status: Alert and oriented x4,  no aphasia, good attention span, fund of knowledge, and memory Motor Exam - grossly normal Sensory Exam - grossly normal Reflexes: 1+ Coordination - grossly normal Gait - grossly normal Balance - grossly normal Cranial Nerves: I: smell Not tested  II: visual acuity  OS: nl    OD: nl  II: visual fields Full to confrontation  II:  pupils Equal, round, reactive to light  III,VII: ptosis None  III,IV,VI: extraocular muscles  Full ROM  V: mastication Normal  V: facial light touch sensation  Normal  V,VII: corneal reflex  Present  VII: facial muscle function - upper  Normal  VII: facial muscle function - lower Normal  VIII: hearing Not tested  IX: soft palate elevation  Normal  IX,X: gag reflex Present  XI: trapezius strength  5/5  XI: sternocleidomastoid strength 5/5  XI: neck flexion strength  5/5  XII: tongue strength   Normal    Data Review Lab Results  Component Value Date   WBC 6.8 05/10/2011   HGB 13.4 05/10/2011   HCT Monica.0 05/10/2011   MCV 92.0 05/10/2011   PLT 307 05/10/2011   Lab Results  Component Value Date   NA 139 05/10/2011   K 3.9 05/10/2011   CL 100 05/10/2011   CO2 31 05/10/2011   BUN 10 05/10/2011   CREATININE 0.75 05/10/2011   GLUCOSE 83 05/10/2011   Lab Results  Component Value Date   INR 1.02 05/10/2011    Assessment/Plan: Patient admitted for PLIF L4-5, L5-S1. Diskogram positive. Patient has failed months of conservative therapy.  I explained the condition and procedure to the patient and answered any questions.  Patient wishes to proceed with procedure as planned. Understands risks/ benefits and typical outcomes of procedure.   Juwuan Sedita S 05/13/2011 6:28 AM

## 2011-05-13 NOTE — Progress Notes (Signed)
Dr Yetta Barre at bedside; evaluated pt. MD states pt may leave PACU and go to room.

## 2011-05-14 NOTE — Evaluation (Signed)
Physical Therapy Evaluation Patient Details Name: Monica Short MRN: [id number] DOB: 1980/04/25 Today's Date: 05/14/2011  Problem List: There is no problem list on file for this patient.   Past Medical History:  Past Medical History  Diagnosis Date  . 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 Anxiety attack   . History of substance abuse     clean for 8 years  . Chronic lower back pain   . Bronchitis     "over the years; last time was 02/2011"  . Heart murmur     "as a child"  . GERD (gastroesophageal reflux disease)   . Recurrent UTI   . Panic attacks 05/13/11    "have had a few lately"   Past Surgical History:  Past Surgical History  Procedure Date  . Cholecystectomy 2008  . Carpal tunnel release 2010/2013    bilaterlly  . Tubal ligation 2011  . Posterior fusion lumbar spine 05/13/11  . Anterior cervical decomp/discectomy fusion 10/2007  . Tonsillectomy and adenoidectomy 1992    PT Assessment/Plan/Recommendation PT Assessment Clinical Impression Statement: Pt presents with a medical diagnosis s/p PLIF L4-S1. Pt is near baseline functional level. Pt will benefit from skilled PT in the acute care setting in order to maximize functional mobility and safety prior to d/c PT Recommendation/Assessment: Patient will need skilled PT in the acute care venue PT Problem List: Decreased activity tolerance;Decreased mobility;Decreased knowledge of use of DME;Decreased knowledge of precautions;Pain PT Therapy Diagnosis : Acute pain;Difficulty walking PT Plan PT Frequency: Min 5X/week PT Treatment/Interventions: DME instruction;Gait training;Stair training;Functional mobility training;Therapeutic activities;Patient/family education PT Recommendation Follow Up Recommendations: No PT follow up;Supervision - Intermittent Equipment Recommended: None recommended by PT PT Goals  Acute Rehab PT Goals PT Goal Formulation: With patient Time For Goal Achievement: 7 days Pt  will go Supine/Side to Sit: Independently PT Goal: Supine/Side to Sit - Progress: Goal set today Pt will go Sit to Supine/Side: Independently PT Goal: Sit to Supine/Side - Progress: Goal set today Pt will go Sit to Stand: Independently PT Goal: Sit to Stand - Progress: Goal set today Pt will go Stand to Sit: Independently PT Goal: Stand to Sit - Progress: Goal set today Pt will Transfer Bed to Chair/Chair to Bed: with modified independence PT Transfer Goal: Bed to Chair/Chair to Bed - Progress: Goal set today Pt will Ambulate: >150 feet;with modified independence PT Goal: Ambulate - Progress: Goal set today Pt will Go Up / Down Stairs: 3-5 stairs;with rail(s);with modified independence PT Goal: Up/Down Stairs - Progress: Goal set today  PT Evaluation Precautions/Restrictions  Precautions Precautions: Back Precaution Booklet Issued: Yes (comment) Precaution Comments: pt educated on back precautions Required Braces or Orthoses: Spinal Brace Spinal Brace: Lumbar corset;Applied in sitting position Restrictions Weight Bearing Restrictions: No Prior Functioning  Home Living Lives With: Other (Comment) (parents, children) Available Help at Discharge: Family;Available 24 hours/day Type of Home: House Home Access: Stairs to enter [employer] of Steps: 5 Entrance Stairs-Rails: Right Home Layout: Two level;Able to live on main level with bedroom/bathroom Bathroom Shower/Tub: Tub/shower unit;Curtain;Walk-in shower Bathroom Toilet: Standard Bathroom Accessibility: Yes How Accessible: Accessible via walker Home Adaptive Equipment: Other (comment);Shower chair with back (rollator) Prior Function Level of Independence: Independent Able to Take Stairs?: Yes Driving: Yes 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 Cognition Cognition Arousal/Alertness: Awake/alert Overall Cognitive Status: Appears within functional limits for tasks assessed Orientation Level:  Oriented X4 Sensation/Coordination Sensation Light Touch: Appears Intact Extremity Assessment RLE Assessment RLE Assessment:  Within Functional Limits LLE Assessment LLE Assessment: Within Functional Limits Mobility (including Balance) Bed Mobility Bed Mobility: No Transfers Transfers: Yes Sit to Stand: 5: Supervision Sit to Stand Details (indicate cue type and reason): VC for hand placement and sequencing to maintain back precautions Stand to Sit: 5: Supervision;With upper extremity assist;To chair/3-in-1 Stand to Sit Details: VC for hand placement. Pt able to control descent Ambulation/Gait Ambulation/Gait: Yes Ambulation/Gait Assistance: 5: Supervision Ambulation/Gait Assistance Details (indicate cue type and reason): Supervision for safety secondary to pain and weakness from decreased ambulation Ambulation Distance (Feet): 300 Feet Assistive device: None Gait Pattern: Step-to pattern;Decreased stride length;Decreased hip/knee flexion - right;Decreased hip/knee flexion - left;Decreased trunk rotation Gait velocity: decreased gait speed Stairs: No    Exercise    End of Session PT - End of Session Equipment Utilized During Treatment: Gait belt;Back brace Activity Tolerance: Patient tolerated treatment well Patient left: in chair;with call bell in reach;with family/visitor present Nurse Communication: Mobility status for transfers;Mobility status for ambulation General Behavior During Session: [facility] for tasks performed Cognition: [facility] for tasks performed Patient Class: Inpatient Milana Kidney 05/14/2011, 5:50 PM  05/14/2011 Milana Kidney DPT PAGER: [phone] OFFICE: [phone]

## 2011-05-14 NOTE — Progress Notes (Signed)
Occupational Therapy Evaluation Patient Details Name: Monica Short MRN: [id number] DOB: Mar 12, 1980 Today's Date: 05/14/2011  Problem List: There is no problem list on file for this patient.   Past Medical History:  Past Medical History  Diagnosis Date  . 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 Anxiety attack   . History of substance abuse     clean for 8 years  . Chronic lower back pain   . Bronchitis     "over the years; last time was 02/2011"  . Heart murmur     "as a child"  . GERD (gastroesophageal reflux disease)   . Recurrent UTI   . Panic attacks 05/13/11    "have had a few lately"   Past Surgical History:  Past Surgical History  Procedure Date  . Cholecystectomy 2008  . Carpal tunnel release 2010/2013    bilaterlly  . Tubal ligation 2011  . Posterior fusion lumbar spine 05/13/11  . Anterior cervical decomp/discectomy fusion 10/2007  . Tonsillectomy and adenoidectomy 1992    OT Assessment/Plan/Recommendation OT Assessment Clinical Impression Statement: 31 yo s/p PLIF. All education completed regarding ADL retraining, back precautions and available DME and AE. No further OT needs.Family present for education. OT Recommendation/Assessment: Patient does not need any further OT services OT Recommendation Follow Up Recommendations: No OT follow up Equipment Recommended: None recommended by OT OT Goals Acute Rehab OT Goals OT Goal Formulation:  (eval only)  OT Evaluation Precautions/Restrictions  Precautions Precautions: Back Precaution Booklet Issued: Yes (comment) Precaution Comments: pt educated on back precautions Required Braces or Orthoses: Spinal Brace Spinal Brace: Lumbar corset;Applied in sitting position Restrictions Weight Bearing Restrictions: No Prior Functioning Home Living Lives With: Other (Comment) (parents, children) Available Help at Discharge: Family;Available 24 hours/day Type of Home: House Home Access: Stairs to  enter [employer] of Steps: 5 Entrance Stairs-Rails: Right Home Layout: Two level;Able to live on main level with bedroom/bathroom Bathroom Shower/Tub: Tub/shower unit;Curtain;Walk-in shower Bathroom Toilet: Standard Bathroom Accessibility: Yes How Accessible: Accessible via walker Home Adaptive Equipment: Other (comment);Shower chair with back (rollator) Prior Function Level of Independence: Independent Able to Take Stairs?: Yes Driving: Yes 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  ADL ADL Eating/Feeding: Performed;Independent Where Assessed - Eating/Feeding: Chair Grooming: Performed;Modified independent Where Assessed - Grooming: Standing at sink Upper Body Bathing: Simulated;Set up Where Assessed - Upper Body Bathing: Sitting, chair Lower Body Bathing: Simulated;Set up;Supervision/safety Where Assessed - Lower Body Bathing: Sit to stand from chair Upper Body Dressing: Performed;Set up Where Assessed - Upper Body Dressing: Sitting, chair;Unsupported Lower Body Dressing: Performed;Supervision/safety;Set up;Other (comment) (reviewed availability of AE) Where Assessed - Lower Body Dressing: Sitting, chair;Unsupported Toilet Transfer: Research scientist (life sciences) Method: Proofreader: Bedside commode Toileting - Clothing Manipulation: Performed;Independent Where Assessed - Toileting Clothing Manipulation: Standing Toileting - Hygiene: Performed;Minimal assistance Where Assessed - Toileting Hygiene: Standing;Other (comment) (educated about toilet aid) Tub/Shower Transfer: Other (comment) (pt plans to use walk in shower. has shower seat.) Tub/Shower Transfer Method: Ambulating Equipment Used: Long-handled sponge;Reacher;Rolling walker;Sock aid Ambulation Related to ADLs: supervision ADL Comments: reviewed available AE with pt/family. Pt/family verbalized understanding of back precautions and ADL. Family has DME if  needed. Has 3 in 1 to use short term. All education completed. No further OT needs. Vision/Perception    Cognition Cognition Arousal/Alertness: Awake/alert Overall Cognitive Status: Appears within functional limits for tasks assessed Orientation Level: Oriented X4 Sensation/Coordination Sensation Light Touch: Appears Intact Coordination Gross Motor Movements are Fluid and Coordinated:  Yes Fine Motor Movements are Fluid and Coordinated: Yes Extremity Assessment RUE Assessment RUE Assessment: Within Functional Limits LUE Assessment LUE Assessment: Within Functional Limits Mobility  Bed Mobility Bed Mobility: No Transfers Sit to Stand: 5: Supervision Sit to Stand Details (indicate cue type and reason): VC for hand placement and sequencing to maintain back precautions Stand to Sit: 5: Supervision;With upper extremity assist;To chair/3-in-1 Stand to Sit Details: VC for hand placement. Pt able to control descent Exercises   End of Session OT - End of Session Equipment Utilized During Treatment: Gait belt;Back brace Activity Tolerance: Patient tolerated treatment well Patient left: in chair;with call bell in reach;with family/visitor present Nurse Communication: Mobility status for transfers General Behavior During Session: [facility] for tasks performed Cognition: [facility] for tasks performed   [facility] 05/14/2011, 6:30 PM  [facility], OTR/L  [phone] 05/14/2011

## 2011-05-14 NOTE — Progress Notes (Signed)
Filed Vitals:   05/13/11 2128 05/14/11 0030 05/14/11 0230 05/14/11 0600  BP: 91/54 106/58  93/51  Pulse: 69  55 62  Temp: 98.2 F (36.8 C)  98.2 F (36.8 C) 97 F (36.1 C)  TempSrc:   Oral Oral  Resp:   18 18  Height:      Weight:      SpO2: 97%   93%    Patient sitting up in chair at bedside. Reasonably comfortable. Minimal staining on dressing. Wound drain in place. Encouraged to ambulate.  Plan: Continued to progress through postoperative recovery.  Hewitt Shorts, MD 05/14/2011, 9:24 AM

## 2011-05-15 NOTE — Progress Notes (Signed)
Patient ID: Monica Short, female   DOB: [birth year], 31 y.o.   MRN: [id number] Subjective:  The patient is alert and pleasant. Her back is appropriately sore.  Objective: Vital signs in last 24 hours: Temp:  [97.8 F (36.6 C)-98.7 F (37.1 C)] 98.4 F (36.9 C) (04/14 0615) Pulse Rate:  [53-64] 53  (04/14 0615) Resp:  [18] 18  (04/14 0615) BP: (89-111)/(52-71) 107/71 mmHg (04/14 0615) SpO2:  [91 %-96 %] 96 % (04/14 0615)  Intake/Output from previous day: 04/13 0701 - 04/14 0700 In: 1120 [P.O.:1120] Out: 250 [Drains:250] Intake/Output this shift:    Physical exam the patient is alert and oriented. She is moving her lower extremities well.  Lab Results: No results found for this basename: WBC:2,HGB:2,HCT:2,PLT:2 in the last 72 hours BMET No results found for this basename: NA:2,K:2,CL:2,CO2:2,GLUCOSE:2,BUN:2,CREATININE:2,CALCIUM:2 in the last 72 hours  Studies/Results: Dg Lumbar Spine 2-3 Views  05/13/2011  *RADIOLOGY REPORT*  Clinical Data: Back pain  LUMBAR SPINE - 2-3 VIEW  Comparison: 04/07/2011  Findings: C-arm films document L4-S1 PLIF.  No adverse features.  IMPRESSION: As above.  Original Report Authenticated By: Elsie Stain, M.D.   Dg C-arm [phone] Min  05/13/2011  *RADIOLOGY REPORT*  Clinical Data: Back pain  LUMBAR SPINE - 2-3 VIEW  Comparison: 04/07/2011  Findings: C-arm films document L4-S1 PLIF.  No adverse features.  IMPRESSION: As above.  Original Report Authenticated By: Elsie Stain, M.D.    Assessment/Plan: Postop day #2: We will remove her Hemovac drain today. And mobilize her with PT OT.  LOS: 2 days     [name] D 05/15/2011, 9:49 AM

## 2011-05-15 NOTE — Progress Notes (Signed)
Physical Therapy Treatment Patient Details Name: Monica Short MRN: [id number] DOB: 06-Sep-1980 Today's Date: 05/15/2011  PT Assessment/Plan  PT - Assessment/Plan Comments on Treatment Session: Pt progressing well. She completed stairs and ambulation with supervision only. Continue per plan to improve safety upon d/c (Pt is safe to d/c home with supervision) PT Plan: Discharge plan remains appropriate;Frequency remains appropriate PT Frequency: Min 5X/week Follow Up Recommendations: No PT follow up;Supervision - Intermittent Equipment Recommended: None recommended by OT;None recommended by PT PT Goals  Acute Rehab PT Goals PT Goal: Sit to Stand - Progress: Progressing toward goal PT Goal: Stand to Sit - Progress: Progressing toward goal PT Transfer Goal: Bed to Chair/Chair to Bed - Progress: Met PT Goal: Ambulate - Progress: Progressing toward goal PT Goal: Up/Down Stairs - Progress: Progressing toward goal  PT Treatment Precautions/Restrictions  Precautions Precautions: Back Precaution Booklet Issued: Yes (comment) Precaution Comments: pt able to recall and demonstrate 3/3 back precautions Required Braces or Orthoses: Spinal Brace Spinal Brace: Lumbar corset;Applied in sitting position Restrictions Weight Bearing Restrictions: No Mobility (including Balance) Bed Mobility Bed Mobility: No Transfers Transfers: Yes Sit to Stand: 6: Modified independent (Device/Increase time) Stand to Sit: 6: Modified independent (Device/Increase time) Ambulation/Gait Ambulation/Gait: Yes Ambulation/Gait Assistance: 5: Supervision Ambulation/Gait Assistance Details (indicate cue type and reason): Supervision for safety secondary to pt with slight unsteadiness and antalgic gait pattern Ambulation Distance (Feet): 200 Feet Assistive device: None Gait Pattern: Step-to pattern;Decreased stride length;Decreased hip/knee flexion - right;Decreased hip/knee flexion - left;Decreased trunk rotation Gait  velocity: decreased gait speed Stairs: Yes Stairs Assistance: 5: Supervision Stairs Assistance Details (indicate cue type and reason): VC for proper sequencing throughout gait for safety Stair Management Technique: One rail Right;Forwards;Step to pattern Number of Stairs: 10     Exercise    End of Session PT - End of Session Equipment Utilized During Treatment: Gait belt;Back brace Activity Tolerance: Patient tolerated treatment well Patient left: in chair;with call bell in reach Nurse Communication: Mobility status for transfers;Mobility status for ambulation General Behavior During Session: [facility] for tasks performed Cognition: [name] for tasks performed  Milana Kidney 05/15/2011, 1:35 PM  05/15/2011 Milana Kidney DPT PAGER: [phone] OFFICE: [phone]

## 2011-05-16 NOTE — Progress Notes (Signed)
UR COMPLETED  

## 2011-05-16 NOTE — Progress Notes (Signed)
Agree with d/c plan  05/16/2011 Milana Kidney DPT PAGER: [phone] OFFICE: [phone]

## 2011-05-16 NOTE — Progress Notes (Signed)
Patient ID: Monica Short, female   DOB: 1980/05/22, 31 y.o.   MRN: [id number] Subjective: Patient reports appropriate back soreness that is improving, with no real leg symptoms. Ambulating in hall taking laps from the nurses station with a walker  Objective: Vital signs in last 24 hours: Temp:  [97.8 F (36.6 C)-98.6 F (37 C)] 98.2 F (36.8 C) (04/15 0623) Pulse Rate:  [46-105] 46  (04/15 0623) Resp:  [18-20] 20  (04/15 0623) BP: (99-120)/(56-74) 117/70 mmHg (04/15 0623) SpO2:  [90 %-100 %] 90 % (04/15 0623)  Intake/Output from previous day:   Intake/Output this shift:    Neurologic: Grossly normal  Lab Results: Lab Results  Component Value Date   WBC 6.8 05/10/2011   HGB 13.4 05/10/2011   HCT 38.0 05/10/2011   MCV 92.0 05/10/2011   PLT 307 05/10/2011   Lab Results  Component Value Date   INR 1.02 05/10/2011   BMET Lab Results  Component Value Date   NA 139 05/10/2011   K 3.9 05/10/2011   CL 100 05/10/2011   CO2 31 05/10/2011   GLUCOSE 83 05/10/2011   BUN 10 05/10/2011   CREATININE 0.75 05/10/2011   CALCIUM 9.4 05/10/2011    Studies/Results: No results found.  Assessment/Plan: Doing well. Like one more day for pain control. Discharge tomorrow.   LOS: 3 days    Monica Short S 05/16/2011, 8:52 AM

## 2011-05-16 NOTE — Progress Notes (Signed)
Physical Therapy Treatment Patient Details Name: CHANDNI GAGAN MRN: [id number] DOB: [birth year] Today's Date: 05/16/2011  PT Assessment/Plan  PT - Assessment/Plan Comments on Treatment Session: Pt making very good progress. Independent with all precautions. Goals met and will sign off from acute PT services PT Plan: All goals met and education completed, patient dischaged from PT services Follow Up Recommendations: No PT follow up;Supervision - Intermittent Equipment Recommended: None recommended by PT;None recommended by OT PT Goals  Acute Rehab PT Goals PT Goal: Sit to Stand - Progress: Met PT Goal: Stand to Sit - Progress: Met PT Transfer Goal: Bed to Chair/Chair to Bed - Progress: Met PT Goal: Ambulate - Progress: Met PT Goal: Up/Down Stairs - Progress: Met  PT Treatment Precautions/Restrictions  Precautions Precautions: Back Precaution Booklet Issued: Yes (comment) Precaution Comments: pt able to recall and demonstrate 3/3 back precautions Required Braces or Orthoses: Spinal Brace Spinal Brace: Lumbar corset;Applied in sitting position Restrictions Weight Bearing Restrictions: No Mobility (including Balance) Transfers Sit to Stand: 7: Independent Stand to Sit: 7: Independent Ambulation/Gait Ambulation/Gait Assistance: 6: Modified independent (Device/Increase time) Ambulation Distance (Feet): 300 Feet Assistive device: Rolling walker Gait Pattern: Within Functional Limits Stairs Assistance: 6: Modified independent (Device/Increase time) Stair Management Technique: One rail Left;Forwards Number of Stairs: 10     Exercise    End of Session PT - End of Session Equipment Utilized During Treatment: Back brace Activity Tolerance: Patient tolerated treatment well Patient left: in chair;with call bell in reach General Behavior During Session: [facility] for tasks performed Cognition: [facility] for tasks performed  Fredrich Birks 05/16/2011, 11:48  AM 05/16/2011 Fredrich Birks PTA [phone] pager [phone] office

## 2011-05-17 MED ORDER — OXYCODONE-ACETAMINOPHEN 5-325 MG PO TABS
1.0000 | ORAL_TABLET | Freq: Four times a day (QID) | ORAL | Status: AC | PRN
Start: 2011-05-17 — End: 2011-05-27

## 2011-05-17 MED ORDER — METHOCARBAMOL 500 MG PO TABS: 500.0000 mg | ORAL_TABLET | Freq: Four times a day (QID) | ORAL | Status: AC | PRN

## 2011-05-17 MED FILL — Sodium Chloride IV Soln 0.9%: INTRAVENOUS | Qty: 1000 | Status: AC

## 2011-05-17 MED FILL — Heparin Sodium (Porcine) Inj 1000 Unit/ML: INTRAMUSCULAR | Qty: 30 | Status: AC

## 2011-05-17 MED FILL — Sodium Chloride Irrigation Soln 0.9%: Qty: 3000 | Status: AC

## 2011-05-17 NOTE — Discharge Summary (Signed)
Physician Discharge Summary  Patient ID: Monica Short MRN: [id number] DOB/AGE: 32/09/1980 32 y.o.  Admit date: 05/13/2011 Discharge date: 05/17/2011  Admission Diagnoses: DDD/ HNP L45, L5S1    Discharge Diagnoses: same   Discharged Condition: good  Hospital Course: The patient was admitted on 05/13/2011 and taken to the operating room where the patient underwent PLIF L4-5, L5-s1. The patient tolerated the procedure well and was taken to the recovery room and then to the floor in stable condition. The hospital course was routine. There were no complications. The wound remained clean dry and intact. Pt had appropriate back soreness. No complaints of leg pain or new N/T/W. The patient remained afebrile with stable vital signs, and tolerated a regular diet. The patient continued to increase activities, and pain was well controlled with oral pain medications.   Consults: None  Significant Diagnostic Studies:  Results for orders placed during the hospital encounter of 05/13/11  HCG, SERUM, QUALITATIVE      Component Value Range   Preg, Serum NEGATIVE  NEGATIVE     Chest 2 View  05/10/2011  *RADIOLOGY REPORT*  Clinical Data: Preop posterior lumbar fusion. Smoker.  CHEST - 2 VIEW  Comparison: 11/23/2006  Findings: Heart and mediastinal contours are within normal limits. No focal opacities or effusions.  No acute bony abnormality.  IMPRESSION: No active cardiopulmonary disease.  Original Report Authenticated By: Cyndie Chime, M.D.   Dg Lumbar Spine 2-3 Views  05/13/2011  *RADIOLOGY REPORT*  Clinical Data: Back pain  LUMBAR SPINE - 2-3 VIEW  Comparison: 04/07/2011  Findings: C-arm films document L4-S1 PLIF.  No adverse features.  IMPRESSION: As above.  Original Report Authenticated By: Elsie Stain, M.D.   Dg C-arm 203 023 1355 Min  05/13/2011  *RADIOLOGY REPORT*  Clinical Data: Back pain  LUMBAR SPINE - 2-3 VIEW  Comparison: 04/07/2011  Findings: C-arm films document L4-S1 PLIF.  No adverse  features.  IMPRESSION: As above.  Original Report Authenticated By: Elsie Stain, M.D.    Antibiotics:  Anti-infectives     Start     Dose/Rate Route Frequency Ordered Stop   05/13/11 1700   ceFAZolin (ANCEF) IVPB 1 g/50 mL premix        1 g 100 mL/hr over 30 Minutes Intravenous Every 8 hours 05/13/11 1602 05/14/11 0049   05/13/11 0759   bacitracin 32440 UNITS injection     Comments: Worthy Flank: cabinet override         05/13/11 0759 05/13/11 1959   05/13/11 0722   bacitracin 10272 UNITS injection  Status:  Discontinued     Comments: WALSH, AMY: cabinet override         05/13/11 0722 05/13/11 0721   05/13/11 0710   bacitracin 50,000 Units in sodium chloride irrigation 0.9 % 500 mL irrigation  Status:  Discontinued          As needed 05/13/11 0811 05/13/11 1132   05/13/11 0611   ceFAZolin (ANCEF) 2-3 GM-% IVPB SOLR     Comments: TODD, ROBERT: cabinet override         05/13/11 0611 05/13/11 1814   05/13/11 0606   ceFAZolin (ANCEF) IVPB 2 g/50 mL premix        2 g 100 mL/hr over 30 Minutes Intravenous 60 min pre-op 05/13/11 0607 05/13/11 0737   05/12/11 1413   ceFAZolin (ANCEF) IVPB 1 g/50 mL premix  Status:  Discontinued        1 g 100 mL/hr over 30 Minutes Intravenous 60  min pre-op 05/12/11 1413 05/13/11 0607          Discharge Exam: Blood pressure 117/74, pulse 57, temperature 98.6 F (37 C), temperature source Oral, resp. rate 18, height 5\' 5"  (1.651 m), weight 84.823 kg (187 lb), last menstrual period 05/09/2011, SpO2 98.00%. Neurologic: Grossly normal Incision CDI  Discharge Medications:   Medication List  As of 05/17/2011  8:23 AM   TAKE these medications         methadone 10 MG/ML solution   Commonly known as: DOLOPHINE   Take 75 mg by mouth daily.      methocarbamol 500 MG tablet   Commonly known as: ROBAXIN   Take 1 tablet (500 mg total) by mouth every 6 (six) hours as needed.      oxyCODONE-acetaminophen 5-325 MG per tablet   Commonly known as:  PERCOCET   Take 1-2 tablets by mouth every 6 (six) hours as needed for pain.            Disposition: home   Final Dx: PLIF  Discharge Orders    Future Orders Please Complete By Expires   Diet - low sodium heart healthy      Increase activity slowly      Driving Restrictions      Comments:   2 weeks   Lifting restrictions      Comments:   Less than 8 lbs   Remove dressing in 24 hours      Call MD for:  temperature >100.4      Call MD for:  persistant nausea and vomiting      Call MD for:  severe uncontrolled pain      Call MD for:  redness, tenderness, or signs of infection (pain, swelling, redness, odor or green/yellow discharge around incision site)      Call MD for:  difficulty breathing, headache or visual disturbances      Call MD for:  persistant dizziness or light-headedness         Follow-up Information    Follow up with Penny Arrambide S, MD. Schedule an appointment as soon as possible for a visit in 2 weeks.   Contact information:   [address] [place] [address] [phone]        Will also follow up with her pain mgmt doctor to continue to wean methadone (current dose 55 mg).   SignedTia Alert 05/17/2011, 8:23 AM

## 2011-05-17 NOTE — Progress Notes (Signed)
CARE MANAGEMENT NOTE 05/17/2011  Patient:  Monica Short, Monica Short   Account Number:  [id number]  Date Initiated:  05/17/2011  Documentation initiated by:  Vance Peper  Subjective/Objective Assessment:     Action/Plan:   No home health or DME needs identified.   Anticipated DC Date:  05/17/2011   Anticipated DC Plan:  HOME/SELF CARE Status of service:  Completed, signed off  Discharge Disposition:  HOME/SELF CARE

## 2011-06-29 ENCOUNTER — Other Ambulatory Visit: Payer: Self-pay | Admitting: Neurological Surgery

## 2011-06-29 DIAGNOSIS — M5137 Other intervertebral disc degeneration, lumbosacral region: Secondary | ICD-10-CM

## 2011-06-30 ENCOUNTER — Ambulatory Visit
Admission: RE | Admit: 2011-06-30 | Discharge: 2011-06-30 | Disposition: A | Discharge status: Home / Self Care | Payer: Medicaid Other | Source: Ambulatory Visit | Attending: Neurological Surgery | Admitting: Neurological Surgery

## 2011-06-30 ENCOUNTER — Other Ambulatory Visit: Payer: Medicaid Other

## 2011-06-30 DIAGNOSIS — M5137 Other intervertebral disc degeneration, lumbosacral region: Secondary | ICD-10-CM

## 2011-07-05 ENCOUNTER — Ambulatory Visit (HOSPITAL_COMMUNITY): Payer: Medicaid Other

## 2011-07-06 ENCOUNTER — Other Ambulatory Visit: Payer: Self-pay | Admitting: Respiratory Therapy

## 2011-07-06 ENCOUNTER — Other Ambulatory Visit: Payer: Self-pay | Admitting: Pharmacist

## 2011-07-06 ENCOUNTER — Other Ambulatory Visit: Payer: Self-pay | Admitting: Neurological Surgery

## 2011-07-06 ENCOUNTER — Ambulatory Visit (HOSPITAL_COMMUNITY)
Admission: RE | Admit: 2011-07-06 | Discharge: 2011-07-06 | Disposition: A | Discharge status: Home / Self Care | Payer: Medicaid Other | Source: Ambulatory Visit | Attending: Neurological Surgery | Admitting: Neurological Surgery

## 2011-07-06 DIAGNOSIS — M7989 Other specified soft tissue disorders: Secondary | ICD-10-CM | POA: Insufficient documentation

## 2011-07-06 DIAGNOSIS — M79609 Pain in unspecified limb: Secondary | ICD-10-CM

## 2011-07-06 DIAGNOSIS — R52 Pain, unspecified: Secondary | ICD-10-CM | POA: Insufficient documentation

## 2011-07-06 DIAGNOSIS — M545 Low back pain: Secondary | ICD-10-CM

## 2011-07-06 DIAGNOSIS — Z9889 Other specified postprocedural states: Secondary | ICD-10-CM | POA: Insufficient documentation

## 2011-07-06 NOTE — Progress Notes (Signed)
VASCULAR LAB PRELIMINARY  PRELIMINARY  PRELIMINARY  PRELIMINARY  Left leg venous duplex completed.    Preliminary report: Left leg is negative for deep and superficial vein thrombosis.  Vanna Scotland,  RVT 07/06/2011, 6:16 PM

## 2011-07-08 ENCOUNTER — Ambulatory Visit
Admission: RE | Admit: 2011-07-08 | Discharge: 2011-07-08 | Disposition: A | Discharge status: Home / Self Care | Payer: Medicaid Other | Source: Ambulatory Visit | Attending: Neurological Surgery | Admitting: Neurological Surgery

## 2011-07-08 DIAGNOSIS — M545 Low back pain: Secondary | ICD-10-CM

## 2011-07-08 MED ORDER — GADOBENATE DIMEGLUMINE 529 MG/ML IV SOLN
18.0000 mL | Freq: Once | INTRAVENOUS | Status: AC | PRN
  Administered 2011-07-08: 18 mL via INTRAVENOUS

## 2015-05-06 ENCOUNTER — Ambulatory Visit: Payer: Self-pay | Admitting: Allergy and Immunology

## 2017-08-08 ENCOUNTER — Encounter: Payer: Self-pay | Admitting: Infectious Diseases

## 2017-10-03 ENCOUNTER — Encounter: Payer: Self-pay | Admitting: Infectious Diseases

## 2017-10-03 ENCOUNTER — Ambulatory Visit (INDEPENDENT_AMBULATORY_CARE_PROVIDER_SITE_OTHER): Payer: Medicaid Other | Admitting: Infectious Diseases

## 2017-10-03 VITALS — BP 108/75 | HR 132 | Wt 201.4 lb

## 2017-10-03 DIAGNOSIS — B182 Chronic viral hepatitis C: Secondary | ICD-10-CM

## 2017-10-03 DIAGNOSIS — Z720 Tobacco use: Secondary | ICD-10-CM | POA: Diagnosis not present

## 2017-10-03 DIAGNOSIS — O99320 Drug use complicating pregnancy, unspecified trimester: Secondary | ICD-10-CM

## 2017-10-03 DIAGNOSIS — F1910 Other psychoactive substance abuse, uncomplicated: Secondary | ICD-10-CM | POA: Insufficient documentation

## 2017-10-03 DIAGNOSIS — N926 Irregular menstruation, unspecified: Secondary | ICD-10-CM | POA: Diagnosis not present

## 2017-10-03 DIAGNOSIS — L80 Vitiligo: Secondary | ICD-10-CM

## 2017-10-03 DIAGNOSIS — B1920 Unspecified viral hepatitis C without hepatic coma: Secondary | ICD-10-CM

## 2017-10-03 HISTORY — DX: Unspecified viral hepatitis C without hepatic coma: B19.20

## 2017-10-03 NOTE — Assessment & Plan Note (Signed)
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

## 2017-10-03 NOTE — Assessment & Plan Note (Signed)
Explained this to pt.

## 2017-10-03 NOTE — Assessment & Plan Note (Addendum)
She has positive HeP C RNA and Ab done at [facility].  Will check her RNA quant, her fibrosure, her lfts, INR.  Her HIV test was (-).  D/i Pharm. Suspect will get mavyret.  rtc 1 month

## 2017-10-03 NOTE — Assessment & Plan Note (Signed)
She is cutting back, encouraged to quit.

## 2017-10-03 NOTE — Assessment & Plan Note (Signed)
Will have her seen by GYN.  

## 2017-10-03 NOTE — Addendum Note (Signed)
Addended by: Bryen Hinderman C on: 10/03/2017 09:59 AM   Modules accepted: Orders

## 2017-10-03 NOTE — Progress Notes (Signed)
   Subjective:    Patient ID: Monica Short, female    DOB: Jul 01, 1980, 37 y.o.   MRN: [id number]  HPI 37 yo F for posterior fusion L4-S1 for DDD and herniated nucleus pulposis on 4-12.  She has also prev had neck surgery.  She also had a hx of substance abuse- after her surgeries. She also gained wt during that time, started shooting meth. Has been through rehab ([facility]). Has been clean 100 days.  Previously heavy drinker.  She was found to have Hep C. No hx of icterus, or change in stool color.   Has 3 daughters. They have good health.   The past medical history, family history and social history were reviewed/updated in EPIC   Review of Systems  Constitutional: Negative for appetite change and unexpected weight change.  Respiratory: Negative for cough and shortness of breath.   Gastrointestinal: Negative for anal bleeding, blood in stool, constipation and diarrhea.  Genitourinary: Positive for menstrual problem (irregular). Negative for difficulty urinating.  Psychiatric/Behavioral: Negative for sleep disturbance. The patient is nervous/anxious.   needs GYN.      Objective:   Physical Exam  Constitutional: She is oriented to person, place, and time. She appears well-developed and well-nourished.  HENT:  Mouth/Throat: No oropharyngeal exudate.  Eyes: Pupils are equal, round, and reactive to light. EOM are normal.  Neck: Normal range of motion. Neck supple.  Cardiovascular: Normal rate, regular rhythm and normal heart sounds.  Pulmonary/Chest: Effort normal and breath sounds normal.  Abdominal: Soft. Bowel sounds are normal. She exhibits no distension. There is no tenderness. There is no guarding.  Musculoskeletal: Normal range of motion. She exhibits no edema.  Lymphadenopathy:    She has no cervical adenopathy.  Neurological: She is alert and oriented to person, place, and time.  Skin:             Assessment & Plan:

## 2017-10-06 ENCOUNTER — Encounter: Payer: Self-pay | Admitting: Obstetrics and Gynecology

## 2017-10-06 LAB — LIVER FIBROSIS, FIBROTEST-ACTITEST
ALPHA-2-MACROGLOBULIN: 222 mg/dL (ref 106–279)
ALT: 100 U/L — AB (ref 6–29)
Apolipoprotein A1: 122 mg/dL (ref 101–198)
Bilirubin: 0.6 mg/dL (ref 0.2–1.2)
FIBROSIS SCORE: 0.36
GGT: 63 U/L — ABNORMAL HIGH (ref 3–50)
Haptoglobin: 68 mg/dL (ref 43–212)
Necroinflammat ACT Score: 0.59
Reference ID: 2624351

## 2017-10-06 LAB — COMPREHENSIVE METABOLIC PANEL
AG Ratio: 1.1 (calc) (ref 1.0–2.5)
ALBUMIN MSPROF: 3.9 g/dL (ref 3.6–5.1)
ALKALINE PHOSPHATASE (APISO): 61 U/L (ref 33–115)
ALT: 100 U/L — ABNORMAL HIGH (ref 6–29)
AST: 89 U/L — AB (ref 10–30)
BILIRUBIN TOTAL: 0.5 mg/dL (ref 0.2–1.2)
BUN: 11 mg/dL (ref 7–25)
CALCIUM: 9 mg/dL (ref 8.6–10.2)
CHLORIDE: 104 mmol/L (ref 98–110)
CO2: 29 mmol/L (ref 20–32)
Creat: 0.85 mg/dL (ref 0.50–1.10)
GLOBULIN: 3.7 g/dL (ref 1.9–3.7)
Glucose, Bld: 82 mg/dL (ref 65–99)
POTASSIUM: 4.3 mmol/L (ref 3.5–5.3)
Sodium: 138 mmol/L (ref 135–146)
Total Protein: 7.6 g/dL (ref 6.1–8.1)

## 2017-10-06 LAB — CBC
HEMATOCRIT: 38.7 % (ref 35.0–45.0)
HEMOGLOBIN: 13.6 g/dL (ref 11.7–15.5)
MCH: 32.9 pg (ref 27.0–33.0)
MCHC: 35.1 g/dL (ref 32.0–36.0)
MCV: 93.5 fL (ref 80.0–100.0)
MPV: 10.9 fL (ref 7.5–12.5)
Platelets: 301 10*3/uL (ref 140–400)
RBC: 4.14 10*6/uL (ref 3.80–5.10)
RDW: 12 % (ref 11.0–15.0)
WBC: 4.9 10*3/uL (ref 3.8–10.8)

## 2017-10-06 LAB — HEPATITIS C RNA QUANTITATIVE
HCV Quantitative Log: 6.6 Log IU/mL — ABNORMAL HIGH
HCV RNA, PCR, QN: 3970000 IU/mL — ABNORMAL HIGH

## 2017-10-06 LAB — PROTIME-INR
INR: 1
Prothrombin Time: 10.4 s (ref 9.0–11.5)

## 2017-10-06 LAB — HEPATITIS C GENOTYPE: HCV GENOTYPE: 3

## 2017-10-10 ENCOUNTER — Other Ambulatory Visit: Payer: Self-pay | Admitting: Pharmacist

## 2017-10-10 ENCOUNTER — Ambulatory Visit
Admission: RE | Admit: 2017-10-10 | Discharge: 2017-10-10 | Disposition: A | Discharge status: Home / Self Care | Payer: Medicaid Other | Source: Ambulatory Visit | Attending: Infectious Diseases | Admitting: Infectious Diseases

## 2017-10-10 DIAGNOSIS — B182 Chronic viral hepatitis C: Secondary | ICD-10-CM

## 2017-10-10 MED ORDER — GLECAPREVIR-PIBRENTASVIR 100-40 MG PO TABS: 3.0000 | ORAL_TABLET | Freq: Every day | ORAL | 1 refills | Status: DC

## 2017-10-10 NOTE — Progress Notes (Signed)
Patient's Hep C labs are back.  She has genotype 3, F2/F3 fibrosis, and a 3.9 million viral load.  Will send in Mavyret x 8 weeks to [facility] since she has Medicaid. Will update patient when she is approved and have her come back and see me ~3-4 weeks after she starts.

## 2017-10-11 ENCOUNTER — Telehealth: Payer: Self-pay

## 2017-10-11 ENCOUNTER — Encounter: Payer: Self-pay | Admitting: Pharmacy Technician

## 2017-10-11 MED FILL — MAVYRET 100-40 MG TABS: 100-40 | 28 days supply | Qty: 84 | Fill #0

## 2017-10-11 NOTE — Telephone Encounter (Signed)
Spoke with Monica Short today to inform her that she has been approved for Mavyret x 8 weeks. She plans to start the medication today, 10/11/17, after picking it up from the [facility] outpatient pharmacy. Counseled her that she will need to take 3 tablets all at the same time with food around the same time each day. Discussed with her that potential side effects include fatigue and headache, but that these usually go away after treatment is over. She will follow-up with pharmacy on 11/29/17 at 1045am.  Arvilla Market, PharmD PGY1 Pharmacy Resident Phone [phone] 10/11/2017     12:01 PM

## 2017-10-13 NOTE — Telephone Encounter (Signed)
Thanks so much. 

## 2017-11-06 MED FILL — MAVYRET 100-40 MG TABS: 100-40 | 28 days supply | Qty: 84 | Fill #1

## 2017-11-16 ENCOUNTER — Encounter: Payer: Self-pay | Admitting: Obstetrics and Gynecology

## 2017-11-16 ENCOUNTER — Ambulatory Visit (INDEPENDENT_AMBULATORY_CARE_PROVIDER_SITE_OTHER): Payer: Medicaid Other | Admitting: Obstetrics and Gynecology

## 2017-11-16 ENCOUNTER — Other Ambulatory Visit (HOSPITAL_COMMUNITY)
Admission: RE | Admit: 2017-11-16 | Discharge: 2017-11-16 | Disposition: A | Discharge status: Home / Self Care | Payer: Medicaid Other | Source: Ambulatory Visit | Attending: Obstetrics and Gynecology | Admitting: Obstetrics and Gynecology

## 2017-11-16 VITALS — BP 104/71 | HR 70 | Ht 65.0 in | Wt 205.4 lb

## 2017-11-16 DIAGNOSIS — N926 Irregular menstruation, unspecified: Secondary | ICD-10-CM

## 2017-11-16 NOTE — Patient Instructions (Signed)

## 2017-11-16 NOTE — Progress Notes (Signed)
Pt states irregular periods started over a year ago.

## 2017-11-16 NOTE — Progress Notes (Signed)
Patient ID: Monica Short, female   DOB: [birth year], 37 y.o.   MRN: [id number] Monica Short presents for eval for DUB x 1 yr. Pt reports prior to the last yr having monthly cycles which last 3-5 days This past year she has had more than 1 cycle a month, has skipped months, will have 2-3 days of spotting, cycles now last 5-7 days. Last pap smear several yrs ago. H/O abnormal pap smear with normal follow up Meds as listed BTL TSVD x 3 largest 7# 8 oz, SAB x 1 PSH and PMH as documented  ROS as noted in HPI plus occ stress urinary incont  PE AF VSS Lungs clear Heart RRR Abd soft + BS Breast sym supple no masses or adenopathy GU Nl EGBUS cervix friable, pap smear obtained uterus small mobile non tender no masses  A/P DUB  Pap smear for HM completed today. Will check GYN U/S. Discussed DUB with pt. F/U after U/S completed

## 2017-11-17 LAB — TSH: TSH: 2.81 u[IU]/mL (ref 0.450–4.500)

## 2017-11-21 LAB — CYTOLOGY - PAP
DIAGNOSIS: NEGATIVE
HPV 16/18/45 GENOTYPING: NEGATIVE
HPV: DETECTED — AB

## 2017-11-22 ENCOUNTER — Ambulatory Visit (HOSPITAL_COMMUNITY)
Admission: RE | Admit: 2017-11-22 | Discharge: 2017-11-22 | Disposition: A | Discharge status: Home / Self Care | Payer: Medicaid Other | Source: Ambulatory Visit | Attending: Obstetrics and Gynecology | Admitting: Obstetrics and Gynecology

## 2017-11-22 ENCOUNTER — Telehealth: Payer: Self-pay | Admitting: *Deleted

## 2017-11-22 DIAGNOSIS — N926 Irregular menstruation, unspecified: Secondary | ICD-10-CM | POA: Diagnosis present

## 2017-11-22 NOTE — Telephone Encounter (Addendum)
-----   Message from Hermina Staggers, MD sent at 11/22/2017  2:38 PM EDT ----- Please let Ms Short know that her pap smear was normal but HPV was detected. Will just need to repeat in 1 yr. Thanks Casimiro Needle  10/23  1600 Called pt to give above results. A female answered and stated that Monica had "stepped out for a few minutes". I advised that we will try to reach [name] later today or tomorrow.

## 2017-11-23 ENCOUNTER — Telehealth: Payer: Self-pay

## 2017-11-23 NOTE — Telephone Encounter (Signed)
-----   Message from Hermina Staggers, MD sent at 11/22/2017  2:38 PM EDT ----- Please let Ms Plantz know that her pap smear was normal but HPV was detected. Will just need to repeat in 1 yr. Thanks Casimiro Needle

## 2017-11-23 NOTE — Telephone Encounter (Signed)
Called pt to inform her of her pap results. Pt verbalized understanding and had no questions.

## 2017-11-27 ENCOUNTER — Telehealth: Payer: Self-pay | Admitting: General Practice

## 2017-11-27 NOTE — Telephone Encounter (Signed)
Called pt to inform her that her pap smear was normal but that HPV was detected and she will need to have a repeat Pap in a year.  Pt verbalized understanding.

## 2017-11-27 NOTE — Telephone Encounter (Signed)
-----   Message from Hermina Staggers, MD sent at 11/27/2017  1:10 PM EDT ----- Please let pt know that her U/S revealed a slightly thicken endometrium. Will discuss EMBX at her follow up next week. Thanks Casimiro Needle

## 2017-11-27 NOTE — Telephone Encounter (Signed)
Called & informed patient of results & recommendation. Patient verbalized understanding & had no questions.

## 2017-11-29 ENCOUNTER — Other Ambulatory Visit: Payer: Self-pay | Admitting: Pharmacist

## 2017-11-29 ENCOUNTER — Ambulatory Visit: Payer: Medicaid Other | Admitting: Infectious Diseases

## 2017-11-29 ENCOUNTER — Ambulatory Visit (INDEPENDENT_AMBULATORY_CARE_PROVIDER_SITE_OTHER): Payer: Medicaid Other | Admitting: Pharmacist

## 2017-11-29 DIAGNOSIS — B182 Chronic viral hepatitis C: Secondary | ICD-10-CM

## 2017-11-29 DIAGNOSIS — Z23 Encounter for immunization: Secondary | ICD-10-CM

## 2017-11-29 NOTE — Progress Notes (Signed)
HPI: Monica Short is a 37 y.o. female who presents to [facility] for hepatitis C treatment.   Medication: Mavyret x 8 weeks  Start Date: 10/11/17  Hepatitis C Genotype: 3  Fibrosis Score: F2/F3  Hepatitis C RNA: 3.9 million (10/03/17)  Patient Active Problem List   Diagnosis Date Noted  . Hepatitis C 10/03/2017  . Irregular periods 10/03/2017  . Vitiligo 10/03/2017  . Tobacco use 10/03/2017  . 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  Modified Medications   No medications on file  Discontinued Medications   No medications on file    Allergies: Allergies  Allergen Reactions  . Chlorhexidine Rash    Pt states she can use the soap as long as it is removed and not left on the skin for a long  time    Past Medical History: Past Medical History:  Diagnosis Date  . Anxiety attack   . Bronchitis    "over the years; last time was 02/2011"  . Chronic lower back pain   . 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 GERD (gastroesophageal reflux disease)   . Heart murmur    "as a child"  . Hepatitis C 10/03/2017  . History of substance abuse (HCC)    clean for 8 years  . Panic attacks 05/13/11   "have had a few lately"  . Recurrent UTI     Social History: Social History   Socioeconomic History  . Marital status: Single    Spouse name: Not on file  . Number of children: Not on file  . Years of education: Not on file  . Highest education level: Not on file  Occupational History  . Not on file  Social Needs  . Financial resource strain: Not on file  . Food insecurity:    Worry: Not on file    Inability: Not on file  . Transportation needs:    Medical: Not on file    Non-medical: Not on file  Tobacco Use  . Smoking status:  Current Every Day Smoker    Packs/day: 0.50    Years: 25.00    Pack years: 12.50    Types: Cigarettes  . Smokeless tobacco: Never Used  Substance and Sexual Activity  . Alcohol use: Not Currently  . Drug use: Not Currently    Types: "Crack" cocaine, Marijuana    Comment: 05/13/11 "last drug use 1 1/2 months ago, took toke off joint; heavy drug use stopped 2005"  . Sexual activity: Not Currently    Birth control/protection: Surgical  Lifestyle  . Physical activity:    Days per week: Not on file    Minutes per session: Not on file  . Stress: Not on file  Relationships  . Social connections:    Talks on phone: Not on file    Gets together: Not on file    Attends religious service: Not on file    Active member of club or organization: Not on file    Attends meetings of clubs or organizations: Not on file    Relationship status: Not on file  Other Topics Concern  . Not on file  Social History Narrative  . Not on file    Labs: Hepatitis C Lab Results  Component Value Date  HCVGENOTYPE 3 10/03/2017   HCVRNAPCRQN 3,970,000 (H) 10/03/2017   FIBROSTAGE F1-F2 10/03/2017   Hepatitis B No results found for: HEPBSAB, HEPBSAG, HEPBCAB Hepatitis A No results found for: HAV HIV No results found for: HIV Lab Results  Component Value Date   CREATININE 0.85 10/03/2017   CREATININE 0.75 05/10/2011   CREATININE 0.72 12/01/2008   CREATININE 0.69 10/03/2007   Lab Results  Component Value Date   AST 89 (H) 10/03/2017   ALT 100 (H) 10/03/2017   ALT 100 (H) 10/03/2017   INR 1.0 10/03/2017   INR 1.02 05/10/2011   INR 1.10 12/01/2008    Assessment: Monica Short presents today for hepatitis C treatment follow up. Patient is on week 7/8 of therapy. She reports that some doses have been delayed d/t a variable work schedule, but there have been no completely missed doses. She reports some headaches (4-5) since starting Mavyret, as well as some acne/facial breakouts. She is 5 months clean from  drug use and is about to attend inpatient rehab at [facility] in December for 3 months.   Patient is taking Mavyret x 8 weeks for Hep C infection. Will get hepatitis A and B studies today. Patient is aware of instructions on what to do if a dose is missed -  if less 18 hours from usual time to take Mavyret take missed dose and return to normal schedule; if more than 18 hours has passed skip dose and take the next dose at the usual time. Patient was encouraged to take medication at the same time every day. Encouraged patient to continue her journey in sobriety and to finish her last week of Mavyret. She was counseled headaches could be related to Mavyret and should improve once she completes therapy. Patient also received her flu shot today and was counseled on side effects of arm soreness. Follow up with Monica Short for cure visit 04/15/17.   Plan: - Get HCV RNA, HepB sAb and antigen, and HepA antibody labs  - Received flu shot  - Complete course of Mavyret - Follow up with Monica Short on April 16, 2018 after completion of therapy for cure visit   Amanda Pea, Pharm D PGY1 Pharmacy Resident  11/29/2017      10:40 AM

## 2017-12-02 LAB — COMPREHENSIVE METABOLIC PANEL
AG Ratio: 1.4 (calc) (ref 1.0–2.5)
ALBUMIN MSPROF: 4.1 g/dL (ref 3.6–5.1)
ALKALINE PHOSPHATASE (APISO): 54 U/L (ref 33–115)
ALT: 13 U/L (ref 6–29)
AST: 21 U/L (ref 10–30)
BILIRUBIN TOTAL: 1 mg/dL (ref 0.2–1.2)
BUN: 12 mg/dL (ref 7–25)
CHLORIDE: 102 mmol/L (ref 98–110)
CO2: 28 mmol/L (ref 20–32)
CREATININE: 0.8 mg/dL (ref 0.50–1.10)
Calcium: 9 mg/dL (ref 8.6–10.2)
Globulin: 3 g/dL (calc) (ref 1.9–3.7)
Glucose, Bld: 98 mg/dL (ref 65–99)
POTASSIUM: 4.2 mmol/L (ref 3.5–5.3)
SODIUM: 137 mmol/L (ref 135–146)
TOTAL PROTEIN: 7.1 g/dL (ref 6.1–8.1)

## 2017-12-02 LAB — HEPATITIS A ANTIBODY, TOTAL: HEPATITIS A AB,TOTAL: NONREACTIVE

## 2017-12-02 LAB — HEPATITIS C RNA QUANTITATIVE
HCV QUANT LOG: NOT DETECTED {Log_IU}/mL
HCV RNA, PCR, QN: NOT DETECTED [IU]/mL

## 2017-12-02 LAB — HEPATITIS B SURFACE ANTIBODY,QUALITATIVE: HEP B S AB: NONREACTIVE

## 2017-12-02 LAB — HEPATITIS B SURFACE ANTIGEN: Hepatitis B Surface Ag: NONREACTIVE

## 2017-12-07 ENCOUNTER — Encounter: Payer: Self-pay | Admitting: Obstetrics and Gynecology

## 2017-12-07 ENCOUNTER — Other Ambulatory Visit (HOSPITAL_COMMUNITY)
Admission: RE | Admit: 2017-12-07 | Discharge: 2017-12-07 | Disposition: A | Discharge status: Home / Self Care | Payer: Medicaid Other | Source: Ambulatory Visit | Attending: Obstetrics and Gynecology | Admitting: Obstetrics and Gynecology

## 2017-12-07 ENCOUNTER — Ambulatory Visit (INDEPENDENT_AMBULATORY_CARE_PROVIDER_SITE_OTHER): Payer: Medicaid Other | Admitting: Obstetrics and Gynecology

## 2017-12-07 VITALS — BP 109/76 | HR 72 | Ht 65.0 in | Wt 205.2 lb

## 2017-12-07 DIAGNOSIS — N926 Irregular menstruation, unspecified: Secondary | ICD-10-CM

## 2017-12-07 DIAGNOSIS — Z3202 Encounter for pregnancy test, result negative: Secondary | ICD-10-CM | POA: Diagnosis not present

## 2017-12-07 LAB — POCT PREGNANCY, URINE: PREG TEST UR: NEGATIVE

## 2017-12-07 NOTE — Patient Instructions (Signed)
Intrauterine Device Information An intrauterine device (IUD) is inserted into your uterus to prevent pregnancy. There are two types of IUDs available:  Copper IUD-This type of IUD is wrapped in copper wire and is placed inside the uterus. Copper makes the uterus and fallopian tubes produce a fluid that kills sperm. The copper IUD can stay in place for 10 years.  Hormone IUD-This type of IUD contains the hormone progestin (synthetic progesterone). The hormone thickens the cervical mucus and prevents sperm from entering the uterus. It also thins the uterine lining to prevent implantation of a fertilized egg. The hormone can weaken or kill the sperm that get into the uterus. One type of hormone IUD can stay in place for 5 years, and another type can stay in place for 3 years.  Your health care provider will make sure you are a good candidate for a contraceptive IUD. Discuss with your health care provider the possible side effects. Advantages of an intrauterine device  IUDs are highly effective, reversible, long acting, and low maintenance.  There are no estrogen-related side effects.  An IUD can be used when breastfeeding.  IUDs are not associated with weight gain.  The copper IUD works immediately after insertion.  The hormone IUD works right away if inserted within 7 days of your period starting. You will need to use a backup method of birth control for 7 days if the hormone IUD is inserted at any other time in your cycle.  The copper IUD does not interfere with your female hormones.  The hormone IUD can make heavy menstrual periods lighter and decrease cramping.  The hormone IUD can be used for 3 or 5 years.  The copper IUD can be used for 10 years. Disadvantages of an intrauterine device  The hormone IUD can be associated with irregular bleeding patterns.  The copper IUD can make your menstrual flow heavier and more painful.  You may experience cramping and vaginal bleeding after  insertion. This information is not intended to replace advice given to you by your health care provider. Make sure you discuss any questions you have with your health care provider. Document Released: 12/22/2003 Document Revised: 06/25/2015 Document Reviewed: 07/08/2012 Elsevier Interactive Patient Education  2017 [employer] Vaginal Hysterectomy A vaginal hysterectomy is a procedure to remove all or part of the uterus through a small incision in the vagina. In this procedure, your health care provider may remove your entire uterus, including the lower end (cervix). You may need a vaginal hysterectomy to treat:  Uterine fibroids.  A condition that causes the lining of the uterus to grow in other areas (endometriosis).  Problems with pelvic support.  Cancer of the cervix, ovaries, uterus, or tissue that lines the uterus (endometrium).  Excessive (dysfunctional) uterine bleeding.  When removing your uterus, your health care provider may also remove the organs that produce eggs (ovaries) and the tubes that carry eggs to your uterus (fallopian tubes). After a vaginal hysterectomy, you will no longer be able to have a baby. You will also no longer get your menstrual period. Tell a health care provider about:  Any allergies you have.  All medicines you are taking, including vitamins, herbs, eye drops, creams, and over-the-counter medicines.  Any problems you or family members have had with anesthetic medicines.  Any blood disorders you have.  Any surgeries you have had.  Any medical conditions you have.  Whether you are pregnant or may be pregnant. What are the risks? Generally, this is a  safe procedure. However, problems may occur, including:  Bleeding.  Infection.  A blood clot that forms in your leg and travels to your lungs (pulmonary embolism).  Damage to surrounding organs.  Pain during sex.  What happens before the procedure?  Ask your health care provider what  organs will be removed during surgery.  Ask your health care provider about: ? Changing or stopping your regular medicines. This is especially important if you are taking diabetes medicines or blood thinners. ? Taking medicines such as aspirin and ibuprofen. These medicines can thin your blood. Do not take these medicines before your procedure if your health care provider instructs you not to.  Follow instructions from your health care provider about eating or drinking restrictions.  Do not use any tobacco products, such as cigarettes, chewing tobacco, and e-cigarettes. If you need help quitting, ask your health care provider.  Plan to have someone take you home after discharge from the hospital. What happens during the procedure?  To reduce your risk of infection: ? Your health care team will wash or sanitize their hands. ? Your skin will be washed with soap.  An IV tube will be inserted into one of your veins.  You may be given antibiotic medicine to help prevent infection.  You will be given one or more of the following: ? A medicine to help you relax (sedative). ? A medicine to numb the area (local anesthetic). ? A medicine to make you fall asleep (general anesthetic). ? A medicine that is injected into an area of your body to numb everything beyond the injection site (regional anesthetic).  Your surgeon will make an incision in your vagina.  Your surgeon will locate and remove all or part of your uterus.  Your ovaries and fallopian tubes may be removed at the same time.  The incision will be closed with stitches (sutures) that dissolve over time. The procedure may vary among health care providers and hospitals. What happens after the procedure?  Your blood pressure, heart rate, breathing rate, and blood oxygen level will be monitored often until the medicines you were given have worn off.  You will be encouraged to get up and walk around after a few hours to help prevent  complications.  You may have IV tubes in place for a few days.  You will be given pain medicine as needed.  Do not drive for 24 hours if you were given a sedative. This information is not intended to replace advice given to you by your health care provider. Make sure you discuss any questions you have with your health care provider. Document Released: 05/11/2015 Document Revised: 06/25/2015 Document Reviewed: 02/01/2015 Elsevier Interactive Patient Education  2018 [employer]. Endometrial Biopsy, Care After This sheet gives you information about how to care for yourself after your procedure. Your health care provider may also give you more specific instructions. If you have problems or questions, contact your health care provider. What can I expect after the procedure? After the procedure, it is common to have:  Mild cramping.  A small amount of vaginal bleeding for a few days. This is normal.  Follow these instructions at home:  Take over-the-counter and prescription medicines only as told by your health care provider.  Do not douche, use tampons, or have sexual intercourse until your health care provider approves.  Return to your normal activities as told by your health care provider. Ask your health care provider what activities are safe for you.  Follow  instructions from your health care provider about any activity restrictions, such as restrictions on strenuous exercise or heavy lifting. Contact a health care provider if:  You have heavy bleeding, or bleed for longer than 2 days after the procedure.  You have bad smelling discharge from your vagina.  You have a fever or chills.  You have a burning sensation when urinating or you have difficulty urinating.  You have severe pain in your lower abdomen. Get help right away if:  You have severe cramps in your stomach or back.  You pass large blood clots.  Your bleeding increases.  You become weak or light-headed, or  you pass out. Summary  After the procedure, it is common to have mild cramping and a small amount of vaginal bleeding for a few days.  Do not douche, use tampons, or have sexual intercourse until your health care provider approves.  Return to your normal activities as told by your health care provider. Ask your health care provider what activities are safe for you. This information is not intended to replace advice given to you by your health care provider. Make sure you discuss any questions you have with your health care provider. Document Released: 11/07/2012 Document Revised: 02/03/2016 Document Reviewed: 02/03/2016 Elsevier Interactive Patient Education  2017 [employer].

## 2017-12-07 NOTE — Progress Notes (Signed)
Ms Coley is here to discuss U/S and pap smear results Results reviewed with pt.  Questions answered  Recommend EMBX for further eval of AUB and thicken endometrium. Pt agreeable  ENDOMETRIAL BIOPSY     The indications for endometrial biopsy were reviewed.   Risks of the biopsy including cramping, bleeding, infection, uterine perforation, inadequate specimen and need for additional procedures  were discussed. The patient states she understands and agrees to undergo procedure today. Consent was signed. Time out was performed. Urine HCG was negative. During the pelvic exam, the cervix was prepped with Betadine. A single-toothed tenaculum was placed on the anterior lip of the cervix to stabilize it. The 3 mm pipelle was introduced into the endometrial cavity without difficulty to a depth of 9 cm, and a moderate amount of tissue was obtained and sent to pathology. The instruments were removed from the patient's vagina. Minimal bleeding from the cervix was noted. Bimanual exam found the uterus to be @ 9 weeks size mobile non tender no masses The patient tolerated the procedure well. Routine post-procedure instructions were given to the patient.    A/P AUB  S/P EMBX today, Tx options reviewed with pt. She has tried OCP's in the past and did not tolerate plus she is a smoker. Relative contraindication for use. IUD discuss plus surgery management. Information provide on post [facility] care, IUD and hysterectomy. Pt will be contacted  With BX results and management as per pt's desire

## 2017-12-12 ENCOUNTER — Telehealth: Payer: Self-pay

## 2017-12-12 NOTE — Telephone Encounter (Addendum)
-----   Message from Hermina StaggersMichael L Ervin, MD sent at 12/11/2017  3:24 PM EST ----- [facility] let Ms Monica Short know [facility]. Thanks Casimiro NeedleMichael  Notified pt that her results are normal and to please schedule an appt with Dr. Alysia PennaErvin to discuss management.  Pt agreed with no further questions.

## 2018-04-16 ENCOUNTER — Ambulatory Visit: Payer: Medicaid Other | Admitting: Pharmacist

## 2021-03-29 DIAGNOSIS — M5011 Cervical disc disorder with radiculopathy,  high cervical region: Secondary | ICD-10-CM | POA: Insufficient documentation

## 2021-03-29 DIAGNOSIS — G629 Polyneuropathy, unspecified: Secondary | ICD-10-CM | POA: Insufficient documentation

## 2021-03-29 DIAGNOSIS — F411 Generalized anxiety disorder: Secondary | ICD-10-CM | POA: Insufficient documentation

## 2021-03-29 DIAGNOSIS — J324 Chronic pansinusitis: Secondary | ICD-10-CM | POA: Insufficient documentation

## 2021-03-29 DIAGNOSIS — F331 Major depressive disorder, recurrent, moderate: Secondary | ICD-10-CM | POA: Insufficient documentation

## 2021-03-29 DIAGNOSIS — M5430 Sciatica, unspecified side: Secondary | ICD-10-CM | POA: Insufficient documentation

## 2021-05-20 ENCOUNTER — Other Ambulatory Visit: Payer: Self-pay | Admitting: Physician Assistant

## 2021-05-20 DIAGNOSIS — N6312 Unspecified lump in the right breast, upper inner quadrant: Secondary | ICD-10-CM

## 2021-05-28 ENCOUNTER — Ambulatory Visit
Admission: RE | Admit: 2021-05-28 | Discharge: 2021-05-28 | Disposition: A | Discharge status: Home / Self Care | Payer: Medicaid Other | Source: Ambulatory Visit | Attending: Physician Assistant | Admitting: Physician Assistant

## 2021-05-28 DIAGNOSIS — N6312 Unspecified lump in the right breast, upper inner quadrant: Secondary | ICD-10-CM

## 2023-08-07 ENCOUNTER — Ambulatory Visit: Payer: Self-pay | Admitting: Cardiology

## 2023-08-07 DIAGNOSIS — I471 Supraventricular tachycardia, unspecified: Secondary | ICD-10-CM | POA: Insufficient documentation

## 2023-08-14 ENCOUNTER — Other Ambulatory Visit: Payer: Self-pay

## 2023-08-14 ENCOUNTER — Emergency Department (HOSPITAL_COMMUNITY)
Admission: EM | Admit: 2023-08-14 | Discharge: 2023-08-14 | Disposition: A | Discharge status: Home / Self Care | Attending: Emergency Medicine | Admitting: Emergency Medicine

## 2023-08-14 DIAGNOSIS — E162 Hypoglycemia, unspecified: Secondary | ICD-10-CM | POA: Diagnosis not present

## 2023-08-14 DIAGNOSIS — I471 Supraventricular tachycardia, unspecified: Secondary | ICD-10-CM | POA: Insufficient documentation

## 2023-08-14 DIAGNOSIS — R002 Palpitations: Secondary | ICD-10-CM | POA: Diagnosis present

## 2023-08-14 LAB — COMPREHENSIVE METABOLIC PANEL WITH GFR
ALT: 20 U/L (ref 0–44)
AST: 34 U/L (ref 15–41)
Albumin: 3.6 g/dL (ref 3.5–5.0)
Alkaline Phosphatase: 62 U/L (ref 38–126)
Anion gap: 10 (ref 5–15)
BUN: 10 mg/dL (ref 6–20)
CO2: 24 mmol/L (ref 22–32)
Calcium: 8.8 mg/dL — ABNORMAL LOW (ref 8.9–10.3)
Chloride: 105 mmol/L (ref 98–111)
Creatinine, Ser: 0.75 mg/dL (ref 0.44–1.00)
GFR, Estimated: >60 mL/min (ref >60)
Glucose, Bld: 67 mg/dL — ABNORMAL LOW (ref 70–99)
Potassium: 3.8 mmol/L (ref 3.5–5.1)
Sodium: 139 mmol/L (ref 135–145)
Total Bilirubin: 0.7 mg/dL (ref 0.0–1.2)
Total Protein: 6.9 g/dL (ref 6.5–8.1)

## 2023-08-14 LAB — CBC WITH DIFFERENTIAL/PLATELET
Abs Immature Granulocytes: 0.01 K/uL (ref 0.00–0.07)
Basophils Absolute: 0 K/uL (ref 0.0–0.1)
Basophils Relative: 1 %
Eosinophils Absolute: 0 K/uL (ref 0.0–0.5)
Eosinophils Relative: 1 %
HCT: 37.9 % (ref 36.0–46.0)
Hemoglobin: 13.2 g/dL (ref 12.0–15.0)
Immature Granulocytes: 0 %
Lymphocytes Relative: 30 %
Lymphs Abs: 1.9 K/uL (ref 0.7–4.0)
MCH: 33.9 pg (ref 26.0–34.0)
MCHC: 34.8 g/dL (ref 30.0–36.0)
MCV: 97.4 fL (ref 80.0–100.0)
Monocytes Absolute: 0.4 K/uL (ref 0.1–1.0)
Monocytes Relative: 7 %
Neutro Abs: 3.9 K/uL (ref 1.7–7.7)
Neutrophils Relative %: 61 %
Platelets: 375 K/uL (ref 150–400)
RBC: 3.89 MIL/uL (ref 3.87–5.11)
RDW: 11.9 % (ref 11.5–15.5)
WBC: 6.4 K/uL (ref 4.0–10.5)
nRBC: 0 % (ref 0.0–0.2)

## 2023-08-14 LAB — MAGNESIUM: Magnesium: 2 mg/dL (ref 1.7–2.4)

## 2023-08-14 NOTE — ED Notes (Signed)
[place] juice provided to patient.

## 2023-08-14 NOTE — ED Notes (Signed)
 Patient ambulated to bathroom without necessary assistance

## 2023-08-14 NOTE — Discharge Instructions (Signed)
 You are seen in the emergency department today for concerns of supraventricular tachycardia.  You appear to have been able to convert yourself out of this rhythm with vagal maneuvers.  Please continue taking your medications as prescribed.  I placed a referral to cardiology for you in an effort to try to have you seen by cardiologist sooner.  You may also keep your current appointment with your cardiologist with [facility] that you are waiting on.  For any concerns of new or worsening symptoms, return the emergency department.  Avoid any possible stimulating substances that could cause this heart rhythm to develop including caffeine, or IV and recreational drugs.  Try to ensure adequate hydration to also prevent development of an elevated heart rate.  Again, if you have any development of new or worsening symptoms, return to the emergency department.

## 2023-08-14 NOTE — ED Notes (Signed)
 MD at bedside.

## 2023-08-14 NOTE — ED Provider Notes (Signed)
 [place] EMERGENCY DEPARTMENT AT [facility] Provider Note   CSN: [id number] Arrival date & time: 08/14/23  9181     Patient presents with: No chief complaint on file.   Monica Short is a 43 y.o. female.  Patient past history significant for SVT, anxiety, hepatitis C presents ED with concerns of SVT.  Patient reportedly was able to convert out of SVT with vagal maneuvers.  Patient brought in by EMS from work.  Initially, patient endorsed feelings of palpitations and feelings of fatigue and slight shortness of breath that have now resolved after being able to convert with vagal maneuvers.  She previously had an episode of SVT in May that required adenosine administration.  Not currently on any blood thinners.  She is set to follow-up with [facility] cardiology in about 2 weeks after rescheduling her 2 weeks ago.  Denies any recent substance use.  Reports some low caffeine use earlier today with no oral intake prior to caffeine use.  States that she drank a small amount of Coke, less than 50 mg of caffeine, prior to the symptom onset.  HPI     Prior to Admission medications   Not on File    Allergies: Chlorhexidine    Review of Systems  Cardiovascular:  Positive for palpitations.  All other systems reviewed and are negative.   Updated Vital Signs BP (!) 136/101   Pulse 94   Temp 97.9 F (36.6 C) (Oral)   Resp 18   Ht 5' 5 (1.651 m)   Wt 93.1 kg   SpO2 100%   BMI 34.16 kg/m   Physical Exam Vitals and nursing note reviewed.  Constitutional:      General: She is not in acute distress.    Appearance: She is well-developed.  HENT:     Head: Normocephalic and atraumatic.  Eyes:     Conjunctiva/sclera: Conjunctivae normal.  Cardiovascular:     Rate and Rhythm: Normal rate and regular rhythm.     Heart sounds: No murmur heard. Pulmonary:     Effort: Pulmonary effort is normal. No respiratory distress.     Breath sounds: Normal breath sounds. No wheezing  or rales.  Abdominal:     Palpations: Abdomen is soft.     Tenderness: There is no abdominal tenderness.  Musculoskeletal:        General: No swelling.     Cervical back: Neck supple.     Right lower leg: No edema.     Left lower leg: No edema.  Skin:    General: Skin is warm and dry.     Capillary Refill: Capillary refill takes less than 2 seconds.  Neurological:     Mental Status: She is alert.  Psychiatric:        Mood and Affect: Mood normal.     (all labs ordered are listed, but only abnormal results are displayed) Labs Reviewed  COMPREHENSIVE METABOLIC PANEL WITH GFR - Abnormal; Notable for the following components:      Result Value   Glucose, Bld 67 (*)    Calcium 8.8 (*)    All other components within normal limits  CBC WITH DIFFERENTIAL/PLATELET  MAGNESIUM    EKG: EKG Interpretation Date/Time:  Monday August 14 2023 08:31:13 EDT Ventricular Rate:  98 PR Interval:  153 QRS Duration:  103 QT Interval:  378 QTC Calculation: 483 R Axis:   23  Text Interpretation: Sinus rhythm no acute ST/T changes Confirmed by Freddi Hamilton [phone] on  08/14/2023 8:51:55 AM  Radiology: No results found.   Procedures   Medications Ordered in the ED - No data to display                                  Medical Decision Making Amount and/or Complexity of Data Reviewed Labs: ordered.   This patient presents to the ED for concern of SVT, this involves an extensive number of treatment options, and is a complaint that carries with it a high risk of complications and morbidity.  The differential diagnosis includes SVT, atrial fibrillation, dehydration, hypokalemia, hypomagnesemia   Co morbidities that complicate the patient evaluation  History of SVT   Lab Tests:  I Ordered, and personally interpreted labs.  The pertinent results include: CBC unremarkable, CMP with mild hypoglycemia at 67, magnesium unremarkable 2.0   Cardiac Monitoring: / EKG:  The patient was  maintained on a cardiac monitor.  I personally viewed and interpreted the cardiac monitored which showed an underlying rhythm of: Sinus rhythm   Consultations Obtained:  I requested consultation with none,  and discussed lab and imaging findings as well as pertinent plan - they recommend: N/A   Problem List / ED Course / Critical interventions / Medication management  Patient with past history significant for prior episodes of SVT presents ED with concerns of SVT.  Patient was brought in by EMS from work after reportedly falling at an episode of SVT.  Patient was able to vagal out of her SVT rhythm.  She reports improvement in her symptoms when she was able to alleviate the SVT.  She is currently on metoprolol and takes this medication as prescribed.  Was previously treated with adenosine in the emergency department in May for an episode of SVT.  She currently has an outpatient follow-up with cardiology scheduled for about 2 weeks ago.  Has not been seen by cardiology since she was discharged from the hospital.  Does endorse slight caffeine use but likely under 50 mg this morning and not likely contributory. On exam, has no focal findings.  She reports that she is back to baseline.  Heart monitor shows that she is in sinus rhythm with heart rate in the low 90s.  Given patient's symptoms, will assess with basic labs for possible ectopic derangements or possible infectious etiology causing her tachycardia.  No acute evidence of dehydration. Basic labs unremarkable.  Electrolytes all within normal levels.  No evident AKI or signs of dehydration. Given unremarkable workup and resolution of symptoms, discharged home with referral to cardiology placed for further management.  There was no episode of SVT witnessed here in the emergency department today and advised patient to continue with her metoprolol.  Discharged home in stable condition. I have reviewed the patients home medicines and have made  adjustments as needed  Final diagnoses:  SVT (supraventricular tachycardia) (HCC)  Hypoglycemia    ED Discharge Orders          Ordered    Ambulatory referral to Cardiology       Comments: If you have not heard from the Cardiology office within the next 72 hours please call [phone].   08/14/23 1117               Cecily Legrand LABOR, PA-C 08/14/23 1509    Freddi Hamilton, MD 08/18/23 [phone]

## 2023-08-14 NOTE — ED Notes (Signed)
 Patient discharged by RN. Patient verbalizes understanding of instructions without additional questions for RN. Ambulatory to lobby at time of discharge.

## 2023-08-14 NOTE — ED Triage Notes (Signed)
 Patient arrives via [facility] from work. Patient endorsed palpitations and keep working, progressively getting worse. Vagaled herself with relief of symptoms and successful conversion. HR rose to 240 prior to vagal. HR 94 after. NSR. 20 LAC. Alert and oriented. Previous SVT with adenosine in May.   BP 158/94 CBG 122 98 on room air

## 2023-08-28 ENCOUNTER — Ambulatory Visit: Payer: Self-pay | Attending: Cardiology | Admitting: Cardiology

## 2023-08-28 VITALS — BP 140/98 | HR 64 | Ht 65.0 in | Wt 201.8 lb

## 2023-08-28 DIAGNOSIS — Z720 Tobacco use: Secondary | ICD-10-CM | POA: Diagnosis not present

## 2023-08-28 DIAGNOSIS — Z8249 Family history of ischemic heart disease and other diseases of the circulatory system: Secondary | ICD-10-CM

## 2023-08-28 DIAGNOSIS — R0609 Other forms of dyspnea: Secondary | ICD-10-CM

## 2023-08-28 DIAGNOSIS — F411 Generalized anxiety disorder: Secondary | ICD-10-CM | POA: Diagnosis not present

## 2023-08-28 DIAGNOSIS — I471 Supraventricular tachycardia, unspecified: Secondary | ICD-10-CM

## 2023-08-28 DIAGNOSIS — R0600 Dyspnea, unspecified: Secondary | ICD-10-CM | POA: Diagnosis not present

## 2023-08-28 NOTE — Progress Notes (Unsigned)
 Cardiology Consultation:    Date:  08/28/2023   ID:  Monica Short, DOB May 14, 1980, MRN [id number]  PCP:  Erick Greig LABOR, NP  Cardiologist:  Lamar Fitch, MD   Referring MD: Cleotilde Norleen HERO, DO   Chief Complaint  Patient presents with   SVT    History of Present Illness:    Monica Short is a 43 y.o. female who is being seen today for the evaluation of SVT at the request of Cleotilde Norleen HERO, DO.  Past medical history significant for SVT, anxiety, hepatitis C.  Was referred to us  because of episode of supraventricular tachycardia.  Apparently first episode happened few weeks ago she ended up calling 911 eventually it lasted for couple hours she was given adenosine that interrupted the arrhythmia that she was brought to hospital evaluation was unrevealing and she was discharged home.  However a few weeks later which was couple days ago she got another episode at work when her heart stuck speeding up she became dizzy lightheaded not to the point of passing out she also felt some uneasy sensation of the chest but then she started doing some vagal maneuvers and she was able to interrupt this arrhythmia when she came to the emergency rate was gone.  She said she never had it before this is first to her the only episode of I recommend that she have her heart.  Otherwise she is doing well.  She is working physically have no difficulty doing this.  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    GERD (gastroesophageal reflux disease)    Heart murmur    as a child   Hepatitis C 10/03/2017   History of substance abuse (HCC)    clean for 8 years   Panic attacks 05/13/11   have had a few lately   Recurrent UTI     Past Surgical History:  Procedure Laterality Date   ANTERIOR CERVICAL  DECOMP/DISCECTOMY FUSION  10/2007   CARPAL TUNNEL RELEASE  2010/2013   bilaterlly   CHOLECYSTECTOMY  2008   POSTERIOR FUSION LUMBAR SPINE  05/13/11   TONSILLECTOMY AND ADENOIDECTOMY  1992   TUBAL LIGATION  2011    Current Medications: Current Meds  Medication Sig   amphetamine-dextroamphetamine (ADDERALL) 20 MG tablet Take 20 mg by mouth 2 (two) times daily.   metoprolol succinate (TOPROL-XL) 50 MG 24 hr tablet Take 50 mg by mouth daily.   pantoprazole (PROTONIX) 40 MG tablet Take 40 mg by mouth daily.   sertraline (ZOLOFT) 50 MG tablet Take 50 mg by mouth at bedtime.   [DISCONTINUED] venlafaxine XR (EFFEXOR-XR) 37.5 MG 24 hr capsule Take 37.5 mg by mouth daily with breakfast.     Allergies:   Chlorhexidine   Social History   Socioeconomic History   Marital status: Single    Spouse name: Not on file   Number of children: Not on file   Years of education: Not on file   Highest education level: Not on file  Occupational History   Not on file  Tobacco Use   Smoking status: Every Day    Current packs/day: 0.50    Average packs/day: 0.5 packs/day for 25.0 years (12.5 ttl pk-yrs)    Types: Cigarettes  Smokeless tobacco: Never  Vaping Use   Vaping status: Some Days  Substance and Sexual Activity   Alcohol use: Not Currently   Drug use: Not Currently    Types: Crack cocaine, Marijuana    Comment: 05/13/11 last drug use 1 1/2 months ago, took toke off joint; heavy drug use stopped 2005   Sexual activity: Not Currently    Birth control/protection: Surgical  Other Topics Concern   Not on file  Social History Narrative   Not on file   Social Drivers of Health   Financial Resource Strain: Not on file  Food Insecurity: Not on file  Transportation Needs: Not on file  Physical Activity: Not on file  Stress: Not on file  Social Connections: Not on file     Family History: The patient's family history includes COPD in her father; Hypertension in her father and mother;  Macular degeneration in her father. There is no history of Anesthesia problems. ROS:   Please see the history of present illness.    All 14 point review of systems negative except as described per history of present illness.  EKGs/Labs/Other Studies Reviewed:    The following studies were reviewed today: Records from the emergency room reviewed  EKG:  EKG Interpretation Date/Time:  Monday August 28 2023 16:03:14 EDT Ventricular Rate:  64 PR Interval:  156 QRS Duration:  86 QT Interval:  386 QTC Calculation: 398 R Axis:   -4  Text Interpretation: Normal sinus rhythm Normal ECG When compared with ECG of 14-Aug-2023 08:31, PREVIOUS ECG IS PRESENT Confirmed by Bernie Charleston [phone]) on 08/28/2023 4:08:10 PM    Recent Labs: 08/14/2023: ALT 20; BUN 10; Creatinine, Ser 0.75; Hemoglobin 13.2; Magnesium 2.0; Platelets 375; Potassium 3.8; Sodium 139  Recent Lipid Panel No results found for: CHOL, TRIG, HDL, CHOLHDL, VLDL, LDLCALC, LDLDIRECT  Physical Exam:    VS:  BP (!) 140/98 (BP Location: Left Arm, Patient Position: Sitting)   Pulse 64   Ht 5' 5 (1.651 m)   Wt 201 lb 12.8 oz (91.5 kg)   SpO2 97%   BMI 33.58 kg/m     Wt Readings from Last 3 Encounters:  08/28/23 201 lb 12.8 oz (91.5 kg)  08/14/23 205 lb 4 oz (93.1 kg)  12/07/17 205 lb 3.2 oz (93.1 kg)     GEN:  Well nourished, well developed in no acute distress HEENT: Normal NECK: No JVD; No carotid bruits LYMPHATICS: No lymphadenopathy CARDIAC: RRR, no murmurs, no rubs, no gallops RESPIRATORY:  Clear to auscultation without rales, wheezing or rhonchi  ABDOMEN: Soft, non-tender, non-distended MUSCULOSKELETAL:  No edema; No deformity  SKIN: Warm and dry NEUROLOGIC:  Alert and oriented x 3 PSYCHIATRIC:  Normal affect   ASSESSMENT:    1. SVT (supraventricular tachycardia) (HCC)   2. Tobacco use   3. Generalized anxiety disorder   4. Dyspnea on exertion   5. Family history of early CAD    PLAN:    In  order of problems listed above:  Supraventricular tachycardia from the description that patient gave me and she was told her heart was 240 however I have no EKG recording of that event.  She was put on the metoprolol 100 mg daily long-acting since that time she seems to be doing well interestingly after her first episode she was given metoprolol 50 mg but she ran out of it and did not refill and then she ended up having second episode since that time she is religiously taking 100 mg  daily and seems to be control.  I told her there is option for situation like this so we initiated conversation about potential ablation however she only got 2 episode of this arrhythmia during her life on top of that she does have hypertension, therefore, we will metoprolol will help with the blood pressure as well as supraventricular tachycardia. History of tobacco abuse, she quit a few years ago now however she vapes.  Because of overall risk factors for coronary artery disease and mild dyslipidemia ask her to have a calcium score done to determine how aggressive we need to be with management of this problem.  I did review K PN which show me data from 2023 with LDL 108 HDL 52. Dyspnea on exertion: I will ask her to have an echocardiogram done to assess left ventricular ejection fraction. Family history of CAD.  Not   Medication Adjustments/Labs and Tests Ordered: Current medicines are reviewed at length with the patient today.  Concerns regarding medicines are outlined above.  Orders Placed This Encounter  Procedures   CT CARDIAC SCORING (SELF PAY ONLY)   EKG 12-Lead   ECHOCARDIOGRAM COMPLETE   No orders of the defined types were placed in this encounter.   Signed, Lamar DOROTHA Fitch, MD, [facility]. 08/28/2023 4:49 PM    [facility]

## 2023-08-28 NOTE — Patient Instructions (Addendum)
 Medication Instructions:  Your physician recommends that you continue on your current medications as directed. Please refer to the Current Medication list given to you today.  *If you need a refill on your cardiac medications before your next appointment, please call your pharmacy*   Lab Work: None Ordered If you have labs (blood work) drawn today and your tests are completely normal, you will receive your results only by: MyChart Message (if you have MyChart) OR A paper copy in the mail If you have any lab test that is abnormal or we need to change your treatment, we will call you to review the results.   Testing/Procedures: Your physician has requested that you have an echocardiogram. Echocardiography is a painless test that uses sound waves to create images of your heart. It provides your doctor with information about the size and shape of your heart and how well your heart's chambers and valves are working. This procedure takes approximately one hour. There are no restrictions for this procedure. Please do NOT wear cologne, perfume, aftershave, or lotions (deodorant is allowed). Please arrive 15 minutes prior to your appointment time.  Please note: We ask at that you not bring children with you during ultrasound (echo/ vascular) testing. Due to room size and safety concerns, children are not allowed in the ultrasound rooms during exams. Our front office staff cannot provide observation of children in our lobby area while testing is being conducted. An adult accompanying a patient to their appointment will only be allowed in the ultrasound room at the discretion of the ultrasound technician under special circumstances. We apologize for any inconvenience.   We will order CT coronary calcium score. It will cost $99.00 and is not covered by insurance.  Please call to schedule.    [facility] [address] [place], [place] [address] [phone]   Follow-Up: At [facility], you  and your health needs are our priority.  As part of our continuing mission to provide you with exceptional heart care, we have created designated Provider Care Teams.  These Care Teams include your primary Cardiologist (physician) and Advanced Practice Providers (APPs -  Physician Assistants and Nurse Practitioners) who all work together to provide you with the care you need, when you need it.  We recommend signing up for the patient portal called "MyChart".  Sign up information is provided on this After Visit Summary.  MyChart is used to connect with patients for Virtual Visits (Telemedicine).  Patients are able to view lab/test results, encounter notes, upcoming appointments, etc.  Non-urgent messages can be sent to your provider as well.   To learn more about what you can do with MyChart, go to [email or web address].    Your next appointment:   3 month(s)  The format for your next appointment:   In Person  Provider:   Ralene Burger, MD    Other Instructions NA

## 2023-09-07 ENCOUNTER — Ambulatory Visit

## 2023-09-28 ENCOUNTER — Ambulatory Visit: Attending: Cardiology

## 2023-09-28 DIAGNOSIS — R0600 Dyspnea, unspecified: Secondary | ICD-10-CM

## 2023-09-28 DIAGNOSIS — R0609 Other forms of dyspnea: Secondary | ICD-10-CM

## 2023-09-28 LAB — ECHOCARDIOGRAM COMPLETE
AR max vel: 2.21 cm2
AV Area VTI: 2.27 cm2
AV Area mean vel: 2.11 cm2
AV Mean grad: 5 mmHg
AV Peak grad: 9.2 mmHg
Ao pk vel: 1.52 m/s
Area-P 1/2: 4.24 cm2
S' Lateral: 3.4 cm

## 2023-09-29 ENCOUNTER — Telehealth: Payer: Self-pay

## 2023-09-29 ENCOUNTER — Ambulatory Visit: Payer: Self-pay | Admitting: Cardiology

## 2023-09-29 NOTE — Telephone Encounter (Signed)
 Left message on My Chart with Echo results per Dr. Karry note. Routed to PCP.

## 2023-10-04 ENCOUNTER — Telehealth: Payer: Self-pay

## 2023-10-04 NOTE — Telephone Encounter (Signed)
 Att x 1. VM not set up

## 2023-11-29 ENCOUNTER — Ambulatory Visit: Payer: Self-pay | Attending: Cardiology | Admitting: Cardiology

## 2023-12-25 IMAGING — MG MM BREAST BX W/ LOC DEV 1ST LESION IMAGE BX SPEC STEREO GUIDE*R*
8 of 11 series · 8 of 19 positions shown · non-contrast
Comparison: Previous exams.
COMPARISON: Previous exams.

Addendum:
CLINICAL DATA: Biopsy of a right breast mass

EXAM:
RIGHT BREAST STEREOTACTIC CORE NEEDLE BIOPSY

[R (1 of 8)]
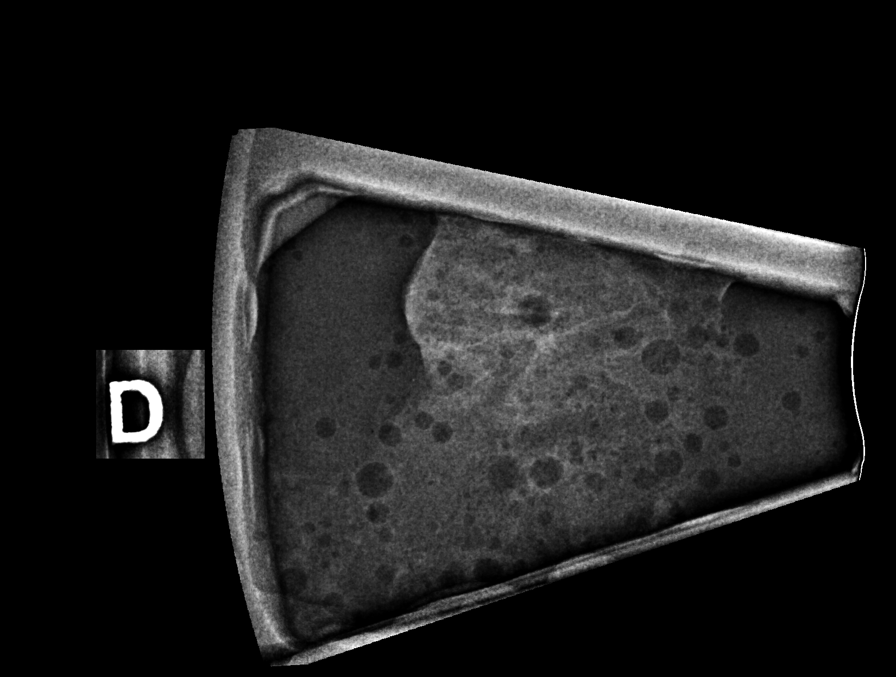

[R (2 of 8)]
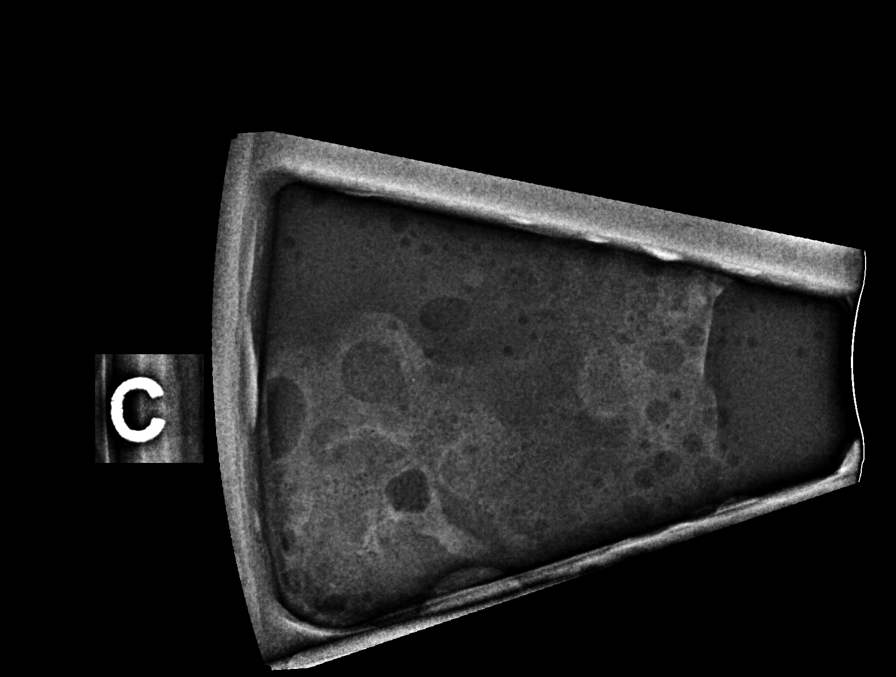

[R (3 of 8)]
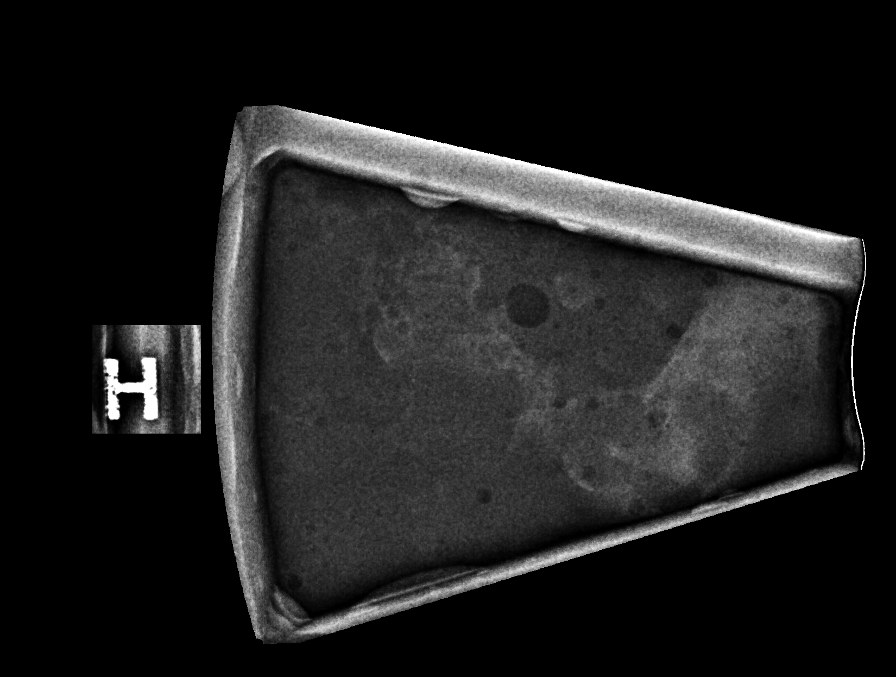

[R (4 of 8)]
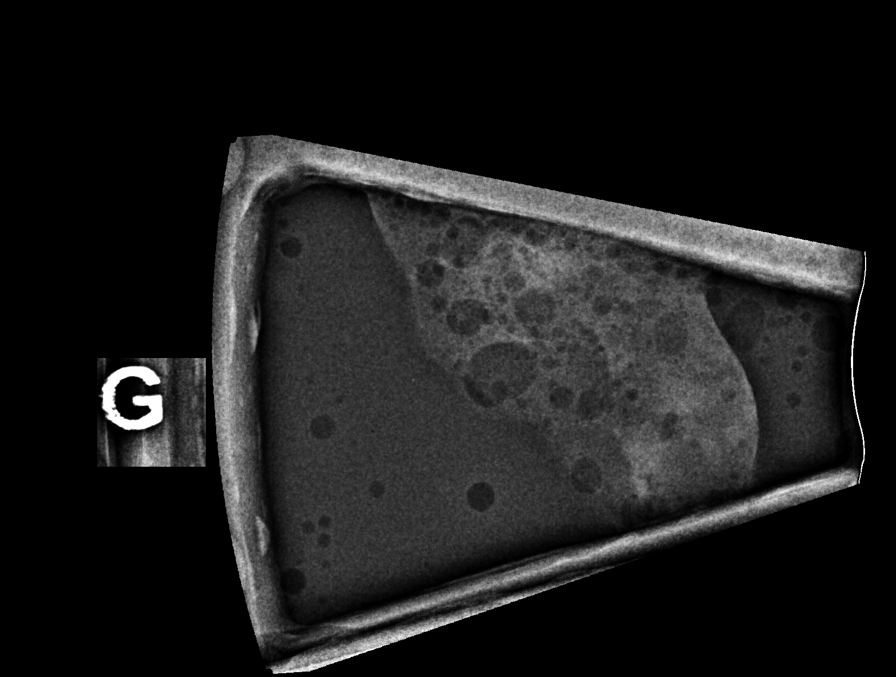

[R (5 of 8)]
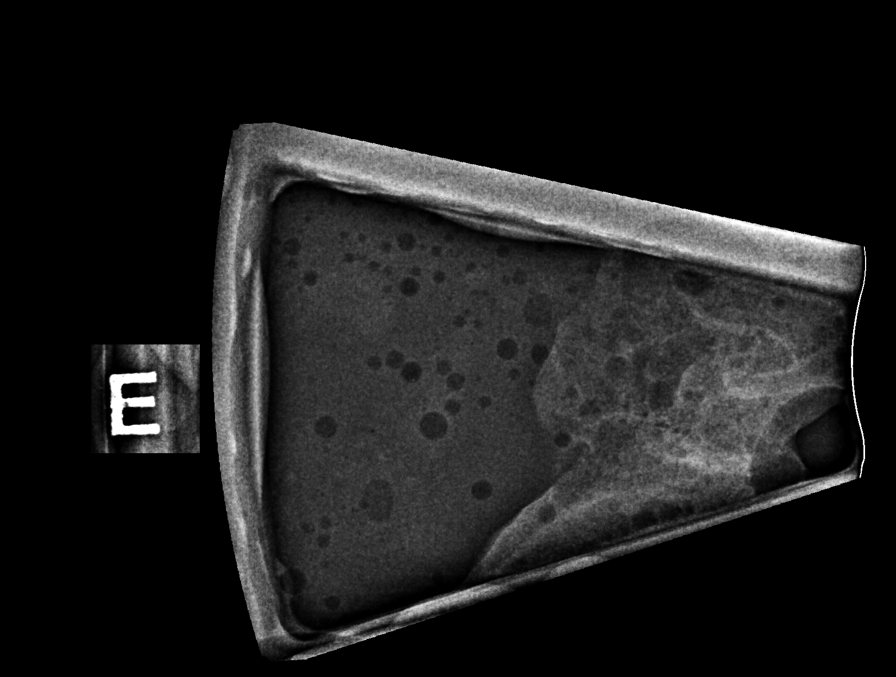

[R (6 of 8)]
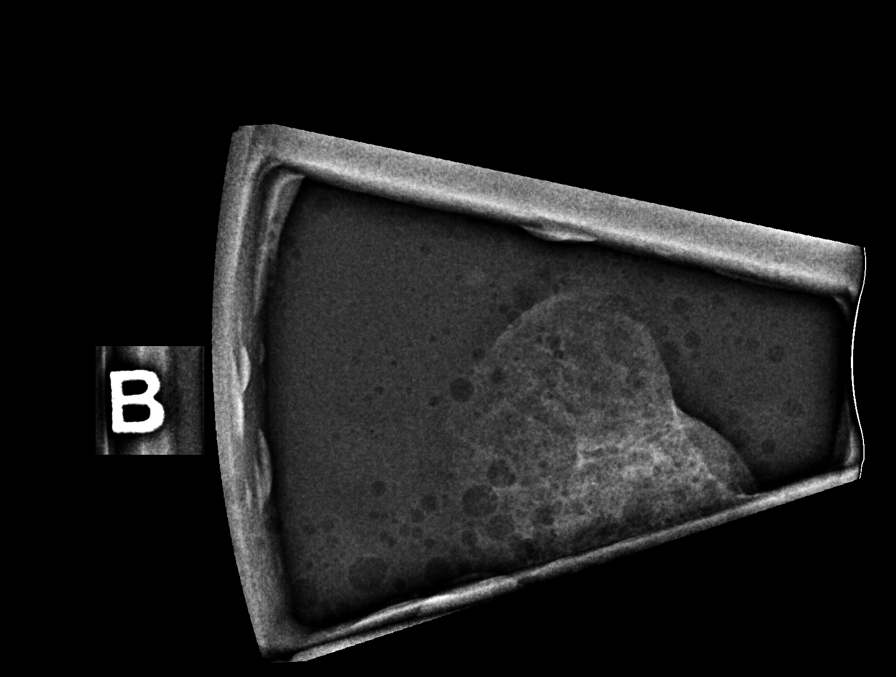

[R (7 of 8)]
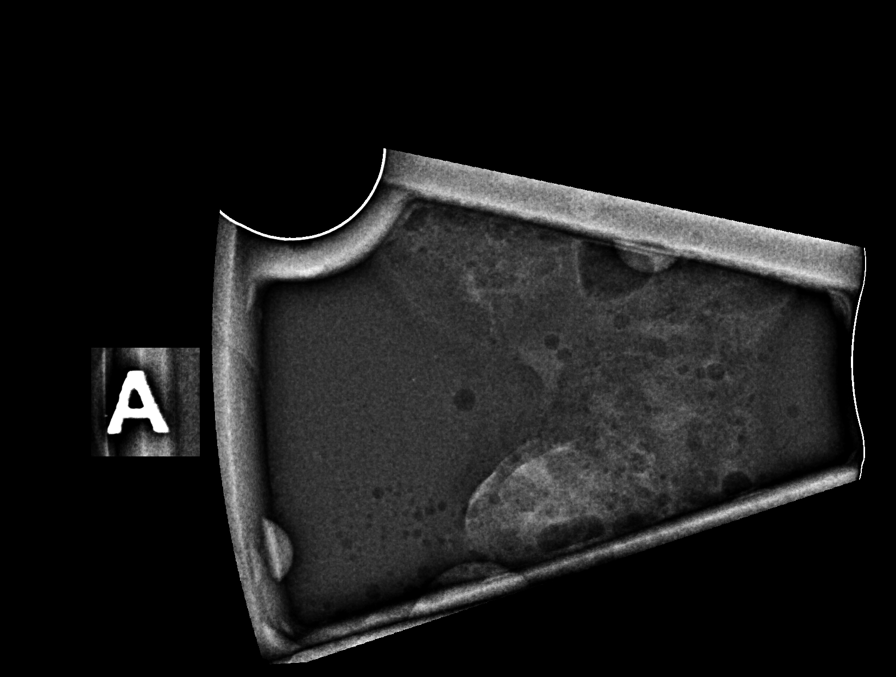

[R (8 of 8)]
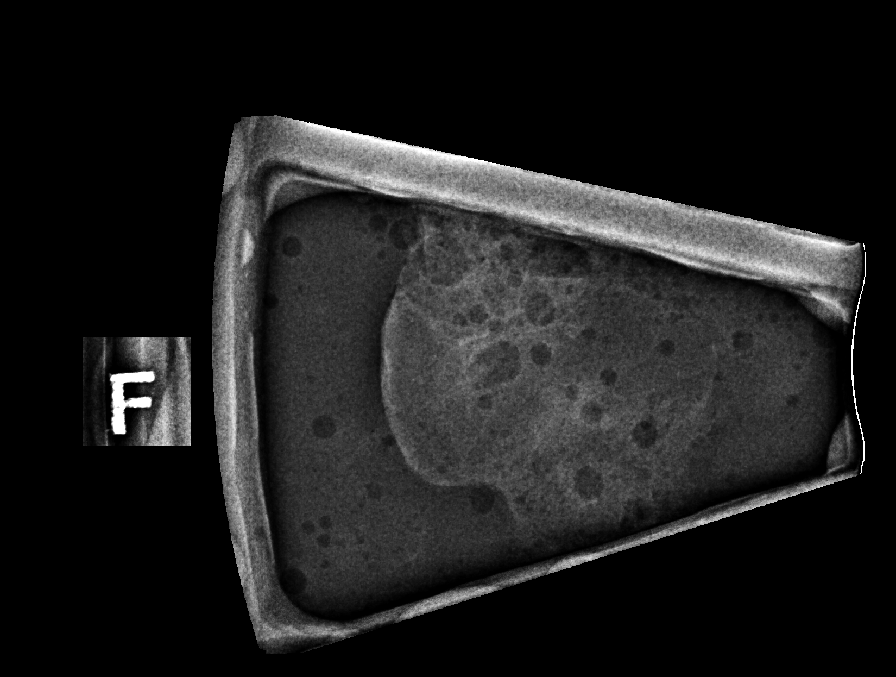

[8 of 19 positions shown; findings below may reference images not displayed]



Using sterile technique and 1% Lidocaine as local anesthetic, under
stereotactic guidance, a 9 gauge vacuum assisted device was used to
perform core needle biopsy of a mass in the upper inner right breast
using a superior approach.

Lesion quadrant: Upper inner right breast

At the conclusion of the procedure, a ribbon shaped tissue marker
clip was deployed into the biopsy cavity. Follow-up 2-view mammogram
was performed and dictated separately.
IMPRESSION: Stereotactic-guided biopsy of a mass in the upper inner right
breast. No apparent complications.

ADDENDUM:
Pathology revealed BENIGN BREAST TISSUE WITH INCREASED STROMAL
FIBROSIS- NO MALIGNANCY IDENTIFIED of the RIGHT breast, upper inner
(ribbon clip). This was found to be concordant by Dr. Joseph Torres
Azeem.

Pathology results were discussed with the patient by telephone. The
patient reported doing well after the biopsy with tenderness at the
site. Post biopsy instructions and care were reviewed and questions
were answered. The patient was encouraged to call The [REDACTED]

The patient was instructed to return for right diagnostic
mammography and ultrasound in 6 months and informed a reminder
notice would be sent regarding this appointment.

Pathology results reported by Fallon Jim RN on 06/01/2021.



Using sterile technique and 1% Lidocaine as local anesthetic, under
stereotactic guidance, a 9 gauge vacuum assisted device was used to
perform core needle biopsy of a mass in the upper inner right breast
using a superior approach.

Lesion quadrant: Upper inner right breast

At the conclusion of the procedure, a ribbon shaped tissue marker
clip was deployed into the biopsy cavity. Follow-up 2-view mammogram
was performed and dictated separately.
IMPRESSION: Stereotactic-guided biopsy of a mass in the upper inner right
breast. No apparent complications.
